# Patient Record
Sex: Female | Born: 1945
Health system: Southern US, Community
[De-identification: ages and names within clinical notes are randomized; demographics above are authoritative.]

## PROBLEM LIST (undated history)

## (undated) DIAGNOSIS — Z87442 Personal history of urinary calculi: Secondary | ICD-10-CM

## (undated) DIAGNOSIS — I10 Essential (primary) hypertension: Secondary | ICD-10-CM

## (undated) HISTORY — DX: Essential (primary) hypertension: I10

## (undated) HISTORY — PX: ABDOMINAL HYSTERECTOMY: SHX81

## (undated) HISTORY — PX: OTHER SURGICAL HISTORY: SHX169

---

## 1998-12-25 ENCOUNTER — Other Ambulatory Visit: Admission: RE | Admit: 1998-12-25 | Discharge: 1998-12-25 | Payer: Self-pay | Admitting: *Deleted

## 2000-03-30 ENCOUNTER — Other Ambulatory Visit: Admission: RE | Admit: 2000-03-30 | Discharge: 2000-03-30 | Payer: Self-pay | Admitting: *Deleted

## 2000-09-16 ENCOUNTER — Observation Stay (HOSPITAL_COMMUNITY): Admission: RE | Admit: 2000-09-16 | Discharge: 2000-09-16 | Payer: Self-pay | Admitting: Surgery

## 2000-09-16 ENCOUNTER — Encounter: Payer: Self-pay | Admitting: Surgery

## 2004-02-28 ENCOUNTER — Other Ambulatory Visit: Admission: RE | Admit: 2004-02-28 | Discharge: 2004-02-28 | Payer: Self-pay | Admitting: Obstetrics and Gynecology

## 2004-06-26 ENCOUNTER — Ambulatory Visit (HOSPITAL_COMMUNITY): Admission: RE | Admit: 2004-06-26 | Discharge: 2004-06-26 | Payer: Self-pay | Admitting: Gastroenterology

## 2006-09-16 ENCOUNTER — Other Ambulatory Visit: Admission: RE | Admit: 2006-09-16 | Discharge: 2006-09-16 | Payer: Self-pay | Admitting: Obstetrics and Gynecology

## 2011-04-04 ENCOUNTER — Other Ambulatory Visit (HOSPITAL_COMMUNITY): Payer: Self-pay | Admitting: Family Medicine

## 2011-04-04 DIAGNOSIS — Z1231 Encounter for screening mammogram for malignant neoplasm of breast: Secondary | ICD-10-CM

## 2011-05-01 ENCOUNTER — Ambulatory Visit (HOSPITAL_COMMUNITY)
Admission: RE | Admit: 2011-05-01 | Discharge: 2011-05-01 | Disposition: A | Discharge status: Home / Self Care | Payer: Self-pay | Source: Ambulatory Visit | Attending: Family Medicine | Admitting: Family Medicine

## 2011-05-01 DIAGNOSIS — Z1231 Encounter for screening mammogram for malignant neoplasm of breast: Secondary | ICD-10-CM

## 2011-05-13 ENCOUNTER — Other Ambulatory Visit: Payer: Self-pay | Admitting: Family Medicine

## 2011-05-13 DIAGNOSIS — R928 Other abnormal and inconclusive findings on diagnostic imaging of breast: Secondary | ICD-10-CM

## 2011-06-11 ENCOUNTER — Ambulatory Visit
Admission: RE | Admit: 2011-06-11 | Discharge: 2011-06-11 | Disposition: A | Discharge status: Home / Self Care | Payer: Self-pay | Source: Ambulatory Visit | Attending: Family Medicine | Admitting: Family Medicine

## 2011-06-11 DIAGNOSIS — R928 Other abnormal and inconclusive findings on diagnostic imaging of breast: Secondary | ICD-10-CM

## 2011-06-13 ENCOUNTER — Ambulatory Visit (INDEPENDENT_AMBULATORY_CARE_PROVIDER_SITE_OTHER): Payer: Self-pay | Admitting: *Deleted

## 2011-06-13 VITALS — BP 150/72 | HR 84 | Temp 97.2°F | Ht 62.0 in | Wt 155.3 lb

## 2011-06-13 DIAGNOSIS — N63 Unspecified lump in unspecified breast: Secondary | ICD-10-CM

## 2011-06-13 DIAGNOSIS — Z01419 Encounter for gynecological examination (general) (routine) without abnormal findings: Secondary | ICD-10-CM

## 2011-06-13 DIAGNOSIS — N6310 Unspecified lump in the right breast, unspecified quadrant: Secondary | ICD-10-CM

## 2011-06-13 NOTE — Progress Notes (Signed)
Last mammogram 04/30/11

## 2011-06-13 NOTE — Patient Instructions (Signed)
Taught patient how to perform BSE and gave educational materials to take home. Patient has a history of a complete hysterectomy due to fibroids per patient. Completed Pap smear today since patient couldn't remember when had last Pap smear and let patient know if Pap smear is normal she will not need any further Pap smears per BCCCP and ACOG guidelines. Patient is scheduled for a diagnostic mammogram at the [facility] for next Monday, June 16, 2011. Patient aware of appointment and will be there. Let patient know will follow up with her within the next couple weeks with results by letter or phone. Patient verbalized understanding.

## 2011-06-13 NOTE — Progress Notes (Signed)
Complaints of right breast lump.  Pap Smear:    Completed Pap smear today. Last Pap smear per patient was at least 3-4 years ago. Per patient no history of abnormal Pap smears. Patient has a history of a complete hysterectomy in 1985 related to fibroids. Informed patient that if this Pap smear comes back normal that she would not need any further Pap smears per BCCCP and ACOG guidelines. No Pap smear results are in EPIC.  Physical exam: Breasts Breasts symmetrical. No skin abnormalities bilateral breasts. No nipple retraction bilateral breasts. No nipple discharge bilateral breasts. No lymphadenopathy. No lumps palpated left breast. Palpated small lump in right breast at 7 o'clock around 3 cm from the nipple. No complaints of pain or tenderness on palpation. Patient referred to the [facility] for a diagnostic mammogram of the right breast.          Pelvic/Bimanual   Ext Genitalia No lesions, no swelling and no discharge observed on external genitalia.         Vagina Vagina pink and normal texture. No lesions or discharge observed in vagina. Vaginal Pap smear completed.         Cervix Cervix is not present related to a history of a complete hysterectomy.          Uterus Uterus is absent due to a history of a complete hysterectomy.      Adnexae Bilateral ovaries absent due to a history of a complete hysterectomy.       Rectovaginal No rectal exam completed today since patient had no rectal complaints. No skin abnormalities observed on exam.

## 2011-06-16 ENCOUNTER — Other Ambulatory Visit: Payer: Self-pay | Admitting: Family Medicine

## 2011-06-16 ENCOUNTER — Ambulatory Visit: Admission: RE | Admit: 2011-06-16 | Payer: No Typology Code available for payment source | Source: Ambulatory Visit

## 2011-06-16 ENCOUNTER — Ambulatory Visit
Admission: RE | Admit: 2011-06-16 | Discharge: 2011-06-16 | Disposition: A | Discharge status: Home / Self Care | Payer: No Typology Code available for payment source | Source: Ambulatory Visit | Attending: Family Medicine | Admitting: Family Medicine

## 2011-06-16 DIAGNOSIS — R928 Other abnormal and inconclusive findings on diagnostic imaging of breast: Secondary | ICD-10-CM

## 2011-06-26 ENCOUNTER — Telehealth: Payer: Self-pay | Admitting: *Deleted

## 2011-06-26 NOTE — Telephone Encounter (Signed)
Called patient to give Pap smear results. Let patient know Pap smear was normal and that she does not need any further Pap smears since she had a hysterectomy for benign reasons. Followed up with patient on Diagnostic Mammogram and right breast ultrasound results. Patient has received results from the [facility]. Let her know she will need a screening mammogram in November. Patient verbalized understanding.

## 2012-05-03 ENCOUNTER — Other Ambulatory Visit (HOSPITAL_COMMUNITY): Payer: Self-pay | Admitting: Family Medicine

## 2012-08-24 ENCOUNTER — Other Ambulatory Visit (HOSPITAL_COMMUNITY): Payer: Self-pay | Admitting: Family Medicine

## 2012-08-24 DIAGNOSIS — Z1231 Encounter for screening mammogram for malignant neoplasm of breast: Secondary | ICD-10-CM

## 2012-09-20 ENCOUNTER — Other Ambulatory Visit: Payer: Self-pay

## 2012-09-20 ENCOUNTER — Ambulatory Visit (HOSPITAL_COMMUNITY): Payer: Self-pay | Attending: Family Medicine

## 2012-09-20 ENCOUNTER — Ambulatory Visit
Admission: RE | Admit: 2012-09-20 | Discharge: 2012-09-20 | Disposition: A | Discharge status: Home / Self Care | Payer: Medicare Other | Source: Ambulatory Visit

## 2012-09-20 DIAGNOSIS — Z1231 Encounter for screening mammogram for malignant neoplasm of breast: Secondary | ICD-10-CM

## 2013-09-12 ENCOUNTER — Other Ambulatory Visit: Payer: Self-pay

## 2013-09-12 DIAGNOSIS — Z1231 Encounter for screening mammogram for malignant neoplasm of breast: Secondary | ICD-10-CM

## 2013-09-27 ENCOUNTER — Ambulatory Visit: Payer: Medicare Other

## 2013-10-06 ENCOUNTER — Encounter (INDEPENDENT_AMBULATORY_CARE_PROVIDER_SITE_OTHER): Payer: Self-pay

## 2013-10-06 ENCOUNTER — Ambulatory Visit
Admission: RE | Admit: 2013-10-06 | Discharge: 2013-10-06 | Disposition: A | Discharge status: Home / Self Care | Payer: Medicare Other | Source: Ambulatory Visit

## 2013-10-06 DIAGNOSIS — Z1231 Encounter for screening mammogram for malignant neoplasm of breast: Secondary | ICD-10-CM

## 2014-04-13 ENCOUNTER — Other Ambulatory Visit: Payer: Self-pay | Admitting: Gastroenterology

## 2014-06-07 ENCOUNTER — Ambulatory Visit: Payer: Self-pay | Admitting: Podiatry

## 2014-07-04 ENCOUNTER — Other Ambulatory Visit: Payer: Self-pay | Admitting: Gastroenterology

## 2014-07-07 ENCOUNTER — Encounter (HOSPITAL_COMMUNITY): Payer: Self-pay | Admitting: *Deleted

## 2014-07-18 ENCOUNTER — Ambulatory Visit (HOSPITAL_COMMUNITY): Payer: Medicare Other | Admitting: Anesthesiology

## 2014-07-18 ENCOUNTER — Encounter (HOSPITAL_COMMUNITY): Payer: Self-pay | Admitting: Anesthesiology

## 2014-07-18 ENCOUNTER — Ambulatory Visit (HOSPITAL_COMMUNITY)
Admission: RE | Admit: 2014-07-18 | Discharge: 2014-07-18 | Disposition: A | Discharge status: Home / Self Care | Payer: Medicare Other | Source: Ambulatory Visit | Attending: Gastroenterology | Admitting: Gastroenterology

## 2014-07-18 ENCOUNTER — Encounter (HOSPITAL_COMMUNITY)
Admission: RE | Disposition: A | Discharge status: Home / Self Care | Payer: Self-pay | Source: Ambulatory Visit | Attending: Gastroenterology

## 2014-07-18 DIAGNOSIS — Z1211 Encounter for screening for malignant neoplasm of colon: Secondary | ICD-10-CM | POA: Diagnosis present

## 2014-07-18 DIAGNOSIS — D125 Benign neoplasm of sigmoid colon: Secondary | ICD-10-CM | POA: Diagnosis not present

## 2014-07-18 DIAGNOSIS — I10 Essential (primary) hypertension: Secondary | ICD-10-CM | POA: Insufficient documentation

## 2014-07-18 DIAGNOSIS — M8580 Other specified disorders of bone density and structure, unspecified site: Secondary | ICD-10-CM | POA: Diagnosis not present

## 2014-07-18 DIAGNOSIS — D122 Benign neoplasm of ascending colon: Secondary | ICD-10-CM | POA: Insufficient documentation

## 2014-07-18 DIAGNOSIS — K621 Rectal polyp: Secondary | ICD-10-CM | POA: Diagnosis not present

## 2014-07-18 HISTORY — PX: COLONOSCOPY WITH PROPOFOL: SHX5780

## 2014-07-18 HISTORY — DX: Personal history of urinary calculi: Z87.442

## 2014-07-18 SURGERY — COLONOSCOPY WITH PROPOFOL
Anesthesia: Monitor Anesthesia Care

## 2014-07-18 MED ORDER — LACTATED RINGERS IV SOLN: INTRAVENOUS | Status: DC | PRN

## 2014-07-18 MED ORDER — MIDAZOLAM HCL 2 MG/2ML IJ SOLN
INTRAMUSCULAR | Status: AC
  Filled 2014-07-18: qty 2

## 2014-07-18 MED ORDER — LIDOCAINE HCL (CARDIAC) 20 MG/ML IV SOLN
INTRAVENOUS | Status: AC
  Filled 2014-07-18: qty 5

## 2014-07-18 MED ORDER — PROPOFOL INFUSION 10 MG/ML OPTIME
INTRAVENOUS | Status: DC | PRN
  Administered 2014-07-18: 100 ug/kg/min via INTRAVENOUS

## 2014-07-18 MED ORDER — PROPOFOL 10 MG/ML IV BOLUS
INTRAVENOUS | Status: AC
  Filled 2014-07-18: qty 20

## 2014-07-18 MED ORDER — SODIUM CHLORIDE 0.9 % IV SOLN: INTRAVENOUS | Status: DC

## 2014-07-18 MED ORDER — LACTATED RINGERS IV SOLN
INTRAVENOUS | Status: DC
  Administered 2014-07-18: 08:00:00 via INTRAVENOUS

## 2014-07-18 MED ORDER — MIDAZOLAM HCL 5 MG/5ML IJ SOLN
INTRAMUSCULAR | Status: DC | PRN
  Administered 2014-07-18 (×2): 1 mg via INTRAVENOUS

## 2014-07-18 SURGICAL SUPPLY — 22 items

## 2014-07-18 NOTE — Transfer of Care (Signed)
Immediate Anesthesia Transfer of Care Note  Patient: Gail Palmer  Procedure(s) Performed: Procedure(s): COLONOSCOPY WITH PROPOFOL (N/A)  Patient Location: PACU and Endoscopy Unit  Anesthesia Type:MAC  Level of Consciousness: awake, oriented and patient cooperative  Airway & Oxygen Therapy: Patient Spontanous Breathing and Patient connected to face mask oxygen  Post-op Assessment: Report given to RN and Post -op Vital signs reviewed and stable  Post vital signs: Reviewed and stable  Last Vitals:  Filed Vitals:   07/18/14 0740  BP: 160/71  Temp: 36.8 C  Resp: 12    Complications: No apparent anesthesia complications

## 2014-07-18 NOTE — Anesthesia Postprocedure Evaluation (Signed)
  Anesthesia Post-op Note  Patient: Gail Palmer  Procedure(s) Performed: Procedure(s) (LRB): COLONOSCOPY WITH PROPOFOL (N/A)  Patient Location: PACU  Anesthesia Type: MAC  Level of Consciousness: awake and alert   Airway and Oxygen Therapy: Patient Spontanous Breathing  Post-op Pain: mild  Post-op Assessment: Post-op Vital signs reviewed, Patient's Cardiovascular Status Stable, Respiratory Function Stable, Patent Airway and No signs of Nausea or vomiting  Last Vitals:  Filed Vitals:   07/18/14 0939  BP:   Pulse: 87  Temp:   Resp: 12    Post-op Vital Signs: stable   Complications: No apparent anesthesia complications

## 2014-07-18 NOTE — H&P (Signed)
  Procedure: Screening colonoscopy. Normal screening colonoscopy performed on 06/26/2004  History: The patient is a 69 year old female born January 18, 1946. She is scheduled to undergo a repeat screening colonoscopy with polypectomy to prevent colon cancer.  Medication allergies: Sulfa drugs cause rash.  Past medical history: Osteopenia. Hypertension. TAH-BSO. Cholecystectomy.  Exam: The patient is alert and lying comfortably on the endoscopy stretcher. Abdomen is soft and nontender to palpation. Lungs are clear to auscultation. Cardiac exam reveals a regular rhythm.  Plan: Proceed with screening colonoscopy

## 2014-07-18 NOTE — Op Note (Signed)
Procedure: Screening colonoscopy. Normal screening colonoscopy performed on 06/26/2004  Endoscopist: Earle Gell  Premedication: Propofol administered by anesthesia  Procedure: The patient was placed in the left lateral decubitus position. Anal inspection and digital rectal exam were normal. The Pentax pediatric colonoscope was introduced into the rectum and advanced to the cecum. A normal-appearing appendiceal orifice and ileocecal valve were identified. Colonic preparation for the exam today was good. Withdrawal time was 13 minutes  Rectum. From the distal rectum, a 3 mm sessile polyp was removed with the cold biopsy forceps. Retroflexed view of the distal rectum was otherwise normal.  Sigmoid colon. From the mid sigmoid colon, a 3 mm sessile polyp was removed with the cold biopsy forceps  Descending colon. Normal  Splenic flexure. Normal  Transverse colon. Normal  Hepatic flexure. Normal  Ascending colon. From the mid ascending colon, a 3 mm sessile polyp was removed with the cold biopsy forceps  Cecum and ileocecal valve. Normal  Assessment: A small polyp was removed from the distal rectum, a small polyp was removed from the sigmoid colon, and a small polyp was removed from the ascending colon.  Recommendation: If polyps return adenomatous pathologically, the patient should undergo a surveillance colonoscopy in 5 years. If the polyps returned nonneoplastic pathologically, she should undergo a repeat screening colonoscopy in 10 years

## 2014-07-18 NOTE — Anesthesia Preprocedure Evaluation (Addendum)
Anesthesia Evaluation  Patient identified by MRN, date of birth, ID band Patient awake    Reviewed: Allergy & Precautions, H&P , NPO status , Patient's Chart, lab work & pertinent test results, reviewed documented beta blocker date and time   Airway Mallampati: II  TM Distance: >3 FB Neck ROM: full    Dental no notable dental hx. (+) Teeth Intact, Dental Advisory Given   Pulmonary neg pulmonary ROS,  breath sounds clear to auscultation  Pulmonary exam normal       Cardiovascular Exercise Tolerance: Good hypertension, Pt. on home beta blockers Rhythm:regular Rate:Normal     Neuro/Psych negative neurological ROS  negative psych ROS   GI/Hepatic negative GI ROS, Neg liver ROS,   Endo/Other  negative endocrine ROS  Renal/GU negative Renal ROS  negative genitourinary   Musculoskeletal   Abdominal   Peds  Hematology negative hematology ROS (+)   Anesthesia Other Findings   Reproductive/Obstetrics negative OB ROS                            Anesthesia Physical Anesthesia Plan  ASA: II  Anesthesia Plan: MAC   Post-op Pain Management:    Induction:   Airway Management Planned:   Additional Equipment:   Intra-op Plan:   Post-operative Plan:   Informed Consent: I have reviewed the patients History and Physical, chart, labs and discussed the procedure including the risks, benefits and alternatives for the proposed anesthesia with the patient or authorized representative who has indicated his/her understanding and acceptance.   Dental Advisory Given  Plan Discussed with: CRNA and Surgeon  Anesthesia Plan Comments:         Anesthesia Quick Evaluation

## 2014-07-19 ENCOUNTER — Encounter (HOSPITAL_COMMUNITY): Payer: Self-pay | Admitting: Gastroenterology

## 2014-11-28 ENCOUNTER — Other Ambulatory Visit: Payer: Self-pay

## 2014-11-28 DIAGNOSIS — Z1231 Encounter for screening mammogram for malignant neoplasm of breast: Secondary | ICD-10-CM

## 2015-01-03 ENCOUNTER — Ambulatory Visit
Admission: RE | Admit: 2015-01-03 | Discharge: 2015-01-03 | Disposition: A | Discharge status: Home / Self Care | Payer: Medicare Other | Source: Ambulatory Visit

## 2015-01-03 DIAGNOSIS — Z1231 Encounter for screening mammogram for malignant neoplasm of breast: Secondary | ICD-10-CM

## 2016-03-14 ENCOUNTER — Other Ambulatory Visit: Payer: Self-pay | Admitting: Family Medicine

## 2016-03-14 DIAGNOSIS — Z1231 Encounter for screening mammogram for malignant neoplasm of breast: Secondary | ICD-10-CM

## 2016-04-09 ENCOUNTER — Ambulatory Visit
Admission: RE | Admit: 2016-04-09 | Discharge: 2016-04-09 | Disposition: A | Discharge status: Home / Self Care | Payer: Medicare Other | Source: Ambulatory Visit | Attending: Family Medicine | Admitting: Family Medicine

## 2016-04-09 DIAGNOSIS — Z1231 Encounter for screening mammogram for malignant neoplasm of breast: Secondary | ICD-10-CM

## 2017-03-03 ENCOUNTER — Other Ambulatory Visit: Payer: Self-pay | Admitting: Family Medicine

## 2017-03-03 DIAGNOSIS — Z1231 Encounter for screening mammogram for malignant neoplasm of breast: Secondary | ICD-10-CM

## 2017-04-07 ENCOUNTER — Encounter (HOSPITAL_COMMUNITY): Payer: Self-pay

## 2017-04-13 ENCOUNTER — Ambulatory Visit
Admission: RE | Admit: 2017-04-13 | Discharge: 2017-04-13 | Disposition: A | Discharge status: Home / Self Care | Payer: Medicare Other | Source: Ambulatory Visit | Attending: Family Medicine | Admitting: Family Medicine

## 2017-04-13 DIAGNOSIS — Z1231 Encounter for screening mammogram for malignant neoplasm of breast: Secondary | ICD-10-CM

## 2018-04-08 ENCOUNTER — Other Ambulatory Visit: Payer: Self-pay | Admitting: Family Medicine

## 2018-04-08 DIAGNOSIS — Z1231 Encounter for screening mammogram for malignant neoplasm of breast: Secondary | ICD-10-CM

## 2018-04-21 ENCOUNTER — Ambulatory Visit: Payer: Self-pay | Admitting: Podiatry

## 2018-05-19 ENCOUNTER — Ambulatory Visit
Admission: RE | Admit: 2018-05-19 | Discharge: 2018-05-19 | Disposition: A | Discharge status: Home / Self Care | Payer: Medicare Other | Source: Ambulatory Visit | Attending: Family Medicine | Admitting: Family Medicine

## 2018-05-19 DIAGNOSIS — Z1231 Encounter for screening mammogram for malignant neoplasm of breast: Secondary | ICD-10-CM

## 2018-06-09 ENCOUNTER — Encounter: Payer: Self-pay | Admitting: Podiatry

## 2018-06-09 ENCOUNTER — Ambulatory Visit: Payer: Medicare Other | Admitting: Podiatry

## 2018-06-09 ENCOUNTER — Ambulatory Visit (INDEPENDENT_AMBULATORY_CARE_PROVIDER_SITE_OTHER): Payer: Medicare Other

## 2018-06-09 VITALS — BP 145/76 | HR 102 | Resp 16

## 2018-06-09 DIAGNOSIS — M7661 Achilles tendinitis, right leg: Secondary | ICD-10-CM

## 2018-06-09 DIAGNOSIS — M7662 Achilles tendinitis, left leg: Secondary | ICD-10-CM

## 2018-06-09 DIAGNOSIS — M2041 Other hammer toe(s) (acquired), right foot: Secondary | ICD-10-CM

## 2018-06-09 DIAGNOSIS — M779 Enthesopathy, unspecified: Secondary | ICD-10-CM

## 2018-06-09 DIAGNOSIS — L600 Ingrowing nail: Secondary | ICD-10-CM | POA: Diagnosis not present

## 2018-06-09 MED ORDER — NEOMYCIN-POLYMYXIN-HC 3.5-10000-1 OT SOLN: OTIC | 0 refills | Status: AC

## 2018-06-09 MED ORDER — OXYCODONE-ACETAMINOPHEN 10-325 MG PO TABS: 1.0000 | ORAL_TABLET | Freq: Four times a day (QID) | ORAL | 0 refills | Status: DC | PRN

## 2018-06-09 NOTE — Progress Notes (Signed)
Subjective:   Patient ID: Gail Palmer, female   DOB: 73 y.o.   MRN: [id number]   HPI Patient presents with painful ingrown toenails of the big toes both feet for several year duration and pain in the back of the heels that occurs periodically.  Also has an elevated third digit right that can become tender with certain types of shoe gear.  Patient does not smoke and likes to be active   Review of Systems  All other systems reviewed and are negative.       Objective:  Physical Exam Vitals signs and nursing note reviewed.  Constitutional:      Appearance: She is well-developed.  Pulmonary:     Effort: Pulmonary effort is normal.  Musculoskeletal: Normal range of motion.  Skin:    General: Skin is warm.  Neurological:     Mental Status: She is alert.     Neurovascular status intact muscle strength is adequate range of motion within normal limits with patient noted to have incurvated hallux nail borders medial side bilateral hallux and elevated third digit right and mild discomfort posterior aspect heel region bilateral.  Patient has good digital perfusion well oriented x3     Assessment:  Chronic ingrown toenail deformity hallux bilateral medial border along with hammertoe deformity third right and mild Achilles tendinitis bilateral     Plan:  H&P and all 3 conditions discussed.  We will get a focus on the ingrown toenails today and I discussed correction of nailbeds and explained procedure and risk and patient wants surgery after signing consent form.  Today I infiltrated each hallux 60 mg like Marcaine mixture sterile prep applied and using sterile instrumentation remove the medial borders exposed matrix applied phenol 3 applications 30 seconds followed by alcohol lavage sterile dressing to each toe.  Applied compression dressing advised on leaving it on for 24 hours but to take it off earlier if it should start to throb and also wrote prescription for Corticosporin type  solution and encouraged to call with questions and will be seen back 2 weeks.  Discussed hammertoe deformity which can be corrected explained to her what can be done and Achilles tendon which I recommended stretching exercises for  X-rays indicate previous arthroplasty digit to right and does indicate small spurs posterior aspect heel region bilateral

## 2018-06-09 NOTE — Patient Instructions (Signed)
Soak Instructions    THE DAY AFTER THE PROCEDURE  Place 1/4 cup of epsom salts in a quart of warm tap water.  Submerge your foot or feet with outer bandage intact for the initial soak; this will allow the bandage to become moist and wet for easy lift off.  Once you remove your bandage, continue to soak in the solution for 20 minutes.  This soak should be done twice a day.  Next, remove your foot or feet from solution, blot dry the affected area and cover.  You may use a band aid large enough to cover the area or use gauze and tape.  Apply other medications to the area as directed by the doctor such as polysporin neosporin.  IF YOUR SKIN BECOMES IRRITATED WHILE USING THESE INSTRUCTIONS, IT IS OKAY TO SWITCH TO  WHITE VINEGAR AND WATER. Or you may use antibacterial soap and water to keep the toe clean  Monitor for any signs/symptoms of infection. Call the office immediately if any occur or go directly to the emergency room. Call with any questions/concerns.    Long Term Care Instructions-Post Nail Surgery  You have had your ingrown toenail and root treated with a chemical.  This chemical causes a burn that will drain and ooze like a blister.  This can drain for 6-8 weeks or longer.  It is important to keep this area clean, covered, and follow the soaking instructions dispensed at the time of your surgery.  This area will eventually dry and form a scab.  Once the scab forms you no longer need to soak or apply a dressing.  If at any time you experience an increase in pain, redness, swelling, or drainage, you should contact the office as soon as possible.  Achilles Tendinitis  with Rehab Achilles tendinitis is a disorder of the Achilles tendon. The Achilles tendon connects the large calf muscles (Gastrocnemius and Soleus) to the heel bone (calcaneus). This tendon is sometimes called the heel cord. It is important for pushing-off and standing on your toes and is important for walking, running, or  jumping. Tendinitis is often caused by overuse and repetitive microtrauma. SYMPTOMS  Pain, tenderness, swelling, warmth, and redness may occur over the Achilles tendon even at rest.  Pain with pushing off, or flexing or extending the ankle.  Pain that is worsened after or during activity. CAUSES   Overuse sometimes seen with rapid increase in exercise programs or in sports requiring running and jumping.  Poor physical conditioning (strength and flexibility or endurance).  Running sports, especially training running down hills.  Inadequate warm-up before practice or play or failure to stretch before participation.  Injury to the tendon. PREVENTION   Warm up and stretch before practice or competition.  Allow time for adequate rest and recovery between practices and competition.  Keep up conditioning.  Keep up ankle and leg flexibility.  Improve or keep muscle strength and endurance.  Improve cardiovascular fitness.  Use proper technique.  Use proper equipment (shoes, skates).  To help prevent recurrence, taping, protective strapping, or an adhesive bandage may be recommended for several weeks after healing is complete. PROGNOSIS   Recovery may take weeks to several months to heal.  Longer recovery is expected if symptoms have been prolonged.  Recovery is usually quicker if the inflammation is due to a direct blow as compared with overuse or sudden strain. RELATED COMPLICATIONS   Healing time will be prolonged if the condition is not correctly treated. The injury must be   given plenty of time to heal.  Symptoms can reoccur if activity is resumed too soon.  Untreated, tendinitis may increase the risk of tendon rupture requiring additional time for recovery and possibly surgery. TREATMENT   The first treatment consists of rest anti-inflammatory medication, and ice to relieve the pain.  Stretching and strengthening exercises after resolution of pain will likely help  reduce the risk of recurrence. Referral to a physical therapist or athletic trainer for further evaluation and treatment may be helpful.  A walking boot or cast may be recommended to rest the Achilles tendon. This can help break the cycle of inflammation and microtrauma.  Arch supports (orthotics) may be prescribed or recommended by your caregiver as an adjunct to therapy and rest.  Surgery to remove the inflamed tendon lining or degenerated tendon tissue is rarely necessary and has shown less than predictable results. MEDICATION   Nonsteroidal anti-inflammatory medications, such as aspirin and ibuprofen, may be used for pain and inflammation relief. Do not take within 7 days before surgery. Take these as directed by your caregiver. Contact your caregiver immediately if any bleeding, stomach upset, or signs of allergic reaction occur. Other minor pain relievers, such as acetaminophen, may also be used.  Pain relievers may be prescribed as necessary by your caregiver. Do not take prescription pain medication for longer than 4 to 7 days. Use only as directed and only as much as you need.  Cortisone injections are rarely indicated. Cortisone injections may weaken tendons and predispose to rupture. It is better to give the condition more time to heal than to use them. HEAT AND COLD  Cold is used to relieve pain and reduce inflammation for acute and chronic Achilles tendinitis. Cold should be applied for 10 to 15 minutes every 2 to 3 hours for inflammation and pain and immediately after any activity that aggravates your symptoms. Use ice packs or an ice massage.  Heat may be used before performing stretching and strengthening activities prescribed by your caregiver. Use a heat pack or a warm soak. SEEK MEDICAL CARE IF:  Symptoms get worse or do not improve in 2 weeks despite treatment.  New, unexplained symptoms develop. Drugs used in treatment may produce side effects.  EXERCISES:  RANGE OF  MOTION (ROM) AND STRETCHING EXERCISES - Achilles Tendinitis  These exercises may help you when beginning to rehabilitate your injury. Your symptoms may resolve with or without further involvement from your physician, physical therapist or athletic trainer. While completing these exercises, remember:   Restoring tissue flexibility helps normal motion to return to the joints. This allows healthier, less painful movement and activity.  An effective stretch should be held for at least 30 seconds.  A stretch should never be painful. You should only feel a gentle lengthening or release in the stretched tissue.  STRETCH  Gastroc, Standing   Place hands on wall.  Extend right / left leg, keeping the front knee somewhat bent.  Slightly point your toes inward on your back foot.  Keeping your right / left heel on the floor and your knee straight, shift your weight toward the wall, not allowing your back to arch.  You should feel a gentle stretch in the right / left calf. Hold this position for 10 seconds. Repeat 3 times. Complete this stretch 2 times per day.  STRETCH  Soleus, Standing   Place hands on wall.  Extend right / left leg, keeping the other knee somewhat bent.  Slightly point your toes inward on   your back foot.  Keep your right / left heel on the floor, bend your back knee, and slightly shift your weight over the back leg so that you feel a gentle stretch deep in your back calf.  Hold this position for 10 seconds. Repeat 3 times. Complete this stretch 2 times per day.  STRETCH  Gastrocsoleus, Standing  Note: This exercise can place a lot of stress on your foot and ankle. Please complete this exercise only if specifically instructed by your caregiver.   Place the ball of your right / left foot on a step, keeping your other foot firmly on the same step.  Hold on to the wall or a rail for balance.  Slowly lift your other foot, allowing your body weight to press your heel down  over the edge of the step.  You should feel a stretch in your right / left calf.  Hold this position for 10 seconds.  Repeat this exercise with a slight bend in your knee. Repeat 3 times. Complete this stretch 2 times per day.   STRENGTHENING EXERCISES - Achilles Tendinitis These exercises may help you when beginning to rehabilitate your injury. They may resolve your symptoms with or without further involvement from your physician, physical therapist or athletic trainer. While completing these exercises, remember:   Muscles can gain both the endurance and the strength needed for everyday activities through controlled exercises.  Complete these exercises as instructed by your physician, physical therapist or athletic trainer. Progress the resistance and repetitions only as guided.  You may experience muscle soreness or fatigue, but the pain or discomfort you are trying to eliminate should never worsen during these exercises. If this pain does worsen, stop and make certain you are following the directions exactly. If the pain is still present after adjustments, discontinue the exercise until you can discuss the trouble with your clinician.  STRENGTH - Plantar-flexors   Sit with your right / left leg extended. Holding onto both ends of a rubber exercise band/tubing, loop it around the ball of your foot. Keep a slight tension in the band.  Slowly push your toes away from you, pointing them downward.  Hold this position for 10 seconds. Return slowly, controlling the tension in the band/tubing. Repeat 3 times. Complete this exercise 2 times per day.   STRENGTH - Plantar-flexors   Stand with your feet shoulder width apart. Steady yourself with a wall or table using as little support as needed.  Keeping your weight evenly spread over the width of your feet, rise up on your toes.*  Hold this position for 10 seconds. Repeat 3 times. Complete this exercise 2 times per day.  *If this is too  easy, shift your weight toward your right / left leg until you feel challenged. Ultimately, you may be asked to do this exercise with your right / left foot only.  STRENGTH  Plantar-flexors, Eccentric  Note: This exercise can place a lot of stress on your foot and ankle. Please complete this exercise only if specifically instructed by your caregiver.   Place the balls of your feet on a step. With your hands, use only enough support from a wall or rail to keep your balance.  Keep your knees straight and rise up on your toes.  Slowly shift your weight entirely to your right / left toes and pick up your opposite foot. Gently and with controlled movement, lower your weight through your right / left foot so that your heel drops below   the level of the step. You will feel a slight stretch in the back of your calf at the end position.  Use the healthy leg to help rise up onto the balls of both feet, then lower weight only on the right / left leg again. Build up to 15 repetitions. Then progress to 3 consecutive sets of 15 repetitions.*  After completing the above exercise, complete the same exercise with a slight knee bend (about 30 degrees). Again, build up to 15 repetitions. Then progress to 3 consecutive sets of 15 repetitions.* Perform this exercise 2 times per day.  *When you easily complete 3 sets of 15, your physician, physical therapist or athletic trainer may advise you to add resistance by wearing a backpack filled with additional weight.  STRENGTH - Plantar Flexors, Seated   Sit on a chair that allows your feet to rest flat on the ground. If necessary, sit at the edge of the chair.  Keeping your toes firmly on the ground, lift your right / left heel as far as you can without increasing any discomfort in your ankle. Repeat 3 times. Complete this exercise 2 times a day.  

## 2018-06-09 NOTE — Progress Notes (Signed)
   Subjective:    Patient ID: Gail Palmer, female    DOB: 01-05-1946, 73 y.o.   MRN: [id number]  HPI    Review of Systems  All other systems reviewed and are negative.      Objective:   Physical Exam        Assessment & Plan:

## 2018-06-21 ENCOUNTER — Telehealth: Payer: Self-pay | Admitting: Podiatry

## 2018-06-21 NOTE — Telephone Encounter (Addendum)
I spoke with pt's dtr, Judeen Hammans, she stated the epsom salt and white vinegar are painful to the pt's toe. I offered the use of betadine and to make sure the epsom salt was 1/4 cup in one qt of water, or 1/2 white vinegar in 1 quart of warm water. I told Judeen Hammans since the areas were open that was causing the stinging but should gradually decrease.

## 2018-06-21 NOTE — Telephone Encounter (Signed)
I have a question regarding the toenail procedure my mother had.

## 2018-06-23 ENCOUNTER — Ambulatory Visit: Payer: Medicare Other

## 2018-06-24 ENCOUNTER — Ambulatory Visit (INDEPENDENT_AMBULATORY_CARE_PROVIDER_SITE_OTHER): Payer: Medicare Other | Admitting: Podiatry

## 2018-06-24 ENCOUNTER — Encounter: Payer: Self-pay | Admitting: Podiatry

## 2018-06-24 DIAGNOSIS — L600 Ingrowing nail: Secondary | ICD-10-CM

## 2018-06-24 DIAGNOSIS — M2041 Other hammer toe(s) (acquired), right foot: Secondary | ICD-10-CM | POA: Diagnosis not present

## 2018-06-24 NOTE — Progress Notes (Signed)
Subjective:   Patient ID: Gail Palmer, female   DOB: 73 y.o.   MRN: [id number]   HPI Patient presents stating nail is healing well and had questions concerning her hammertoe third digit right foot   ROS      Objective:  Physical Exam  Neurovascular status intact with crusted hallux nail borders bilateral hallux from previous permanent nail surgery they are healing well with no drainage with elevated third digit right foot     Assessment:  Healing well from nail surgery bilateral with no signs of infection with elevated third digit right that is painful on dorsal surface      Plan:  Reviewed hammertoe deformity and discussed correction of either arthroplasty or tenotomy and at this time she is can wear wider shoes and consider this treatment.  Continue soaking nails continue bandage usage during the day and patient will be seen back as needed

## 2018-07-06 ENCOUNTER — Other Ambulatory Visit: Payer: Self-pay | Admitting: Family Medicine

## 2018-07-06 DIAGNOSIS — I739 Peripheral vascular disease, unspecified: Secondary | ICD-10-CM

## 2018-07-14 ENCOUNTER — Ambulatory Visit
Admission: RE | Admit: 2018-07-14 | Discharge: 2018-07-14 | Disposition: A | Discharge status: Home / Self Care | Payer: Medicare Other | Source: Ambulatory Visit | Attending: Family Medicine | Admitting: Family Medicine

## 2018-07-14 DIAGNOSIS — I739 Peripheral vascular disease, unspecified: Secondary | ICD-10-CM

## 2019-04-25 ENCOUNTER — Other Ambulatory Visit: Payer: Self-pay | Admitting: Family Medicine

## 2019-04-25 DIAGNOSIS — Z1231 Encounter for screening mammogram for malignant neoplasm of breast: Secondary | ICD-10-CM

## 2019-06-16 ENCOUNTER — Other Ambulatory Visit: Payer: Self-pay

## 2019-06-16 ENCOUNTER — Ambulatory Visit
Admission: RE | Admit: 2019-06-16 | Discharge: 2019-06-16 | Disposition: A | Discharge status: Home / Self Care | Payer: Medicare Other | Source: Ambulatory Visit | Attending: Family Medicine | Admitting: Family Medicine

## 2019-06-16 DIAGNOSIS — Z1231 Encounter for screening mammogram for malignant neoplasm of breast: Secondary | ICD-10-CM

## 2019-07-18 DIAGNOSIS — E7800 Pure hypercholesterolemia, unspecified: Secondary | ICD-10-CM | POA: Diagnosis not present

## 2019-07-18 DIAGNOSIS — I10 Essential (primary) hypertension: Secondary | ICD-10-CM | POA: Diagnosis not present

## 2019-09-26 DIAGNOSIS — J310 Chronic rhinitis: Secondary | ICD-10-CM | POA: Insufficient documentation

## 2019-09-26 DIAGNOSIS — H6123 Impacted cerumen, bilateral: Secondary | ICD-10-CM | POA: Insufficient documentation

## 2019-09-26 DIAGNOSIS — J3489 Other specified disorders of nose and nasal sinuses: Secondary | ICD-10-CM | POA: Insufficient documentation

## 2019-09-26 DIAGNOSIS — H6993 Unspecified Eustachian tube disorder, bilateral: Secondary | ICD-10-CM | POA: Insufficient documentation

## 2019-09-26 DIAGNOSIS — J343 Hypertrophy of nasal turbinates: Secondary | ICD-10-CM | POA: Diagnosis not present

## 2019-09-26 DIAGNOSIS — H6983 Other specified disorders of Eustachian tube, bilateral: Secondary | ICD-10-CM | POA: Diagnosis not present

## 2019-11-08 DIAGNOSIS — M109 Gout, unspecified: Secondary | ICD-10-CM | POA: Diagnosis not present

## 2019-11-08 DIAGNOSIS — I10 Essential (primary) hypertension: Secondary | ICD-10-CM | POA: Diagnosis not present

## 2019-11-08 DIAGNOSIS — E7800 Pure hypercholesterolemia, unspecified: Secondary | ICD-10-CM | POA: Diagnosis not present

## 2019-12-22 DIAGNOSIS — E7800 Pure hypercholesterolemia, unspecified: Secondary | ICD-10-CM | POA: Diagnosis not present

## 2019-12-22 DIAGNOSIS — I10 Essential (primary) hypertension: Secondary | ICD-10-CM | POA: Diagnosis not present

## 2020-01-30 ENCOUNTER — Other Ambulatory Visit: Payer: Self-pay

## 2020-01-30 ENCOUNTER — Ambulatory Visit (INDEPENDENT_AMBULATORY_CARE_PROVIDER_SITE_OTHER): Payer: Medicare Other | Admitting: Podiatry

## 2020-01-30 ENCOUNTER — Encounter: Payer: Self-pay | Admitting: Podiatry

## 2020-01-30 VITALS — Temp 97.1°F

## 2020-01-30 DIAGNOSIS — L600 Ingrowing nail: Secondary | ICD-10-CM

## 2020-01-30 MED ORDER — NEOMYCIN-POLYMYXIN-HC 3.5-10000-1 OT SOLN: OTIC | 0 refills | Status: AC

## 2020-01-30 NOTE — Patient Instructions (Signed)

## 2020-01-30 NOTE — Progress Notes (Signed)
Subjective:   Patient ID: Gail Palmer, female   DOB: 74 y.o.   MRN: [id number]   HPI Patient has pain in the medial side of each nail stating that its been about 6 months and there is some skin that gets irritated to and she like somehow to be able to get improved.   ROS      Objective:  Physical Exam  Neurovascular status intact with incurvated medial borders of the right hallux with thick tight nailbeds that are painful when pressed along with thick type tissue which is more due to her skin structure     Assessment:  Chronic ingrown toenail deformity of the hallux bilateral medial side     Plan:  H&P reviewed condition recommended removal of the nail borders explained procedure risk.  Patient wants surgery understanding risk and today I infiltrated each hallux 60 mg like Marcaine mixture and under sterile conditions I remove the medial border exposed matrix bilateral and applied phenol 3 applications 30 seconds followed by alcohol lavage and sterile dressing.  Gave instructions on soaks and reappoint and encouraged her to call with questions and leave dressing on 24 hours but take it off earlier if any throbbing were to occur

## 2020-02-20 DIAGNOSIS — Z1159 Encounter for screening for other viral diseases: Secondary | ICD-10-CM | POA: Diagnosis not present

## 2020-02-23 DIAGNOSIS — D123 Benign neoplasm of transverse colon: Secondary | ICD-10-CM | POA: Diagnosis not present

## 2020-02-23 DIAGNOSIS — Z86010 Personal history of colonic polyps: Secondary | ICD-10-CM | POA: Diagnosis not present

## 2020-02-23 DIAGNOSIS — D124 Benign neoplasm of descending colon: Secondary | ICD-10-CM | POA: Diagnosis not present

## 2020-02-23 DIAGNOSIS — K5730 Diverticulosis of large intestine without perforation or abscess without bleeding: Secondary | ICD-10-CM | POA: Diagnosis not present

## 2020-02-28 DIAGNOSIS — D123 Benign neoplasm of transverse colon: Secondary | ICD-10-CM | POA: Diagnosis not present

## 2020-02-28 DIAGNOSIS — D124 Benign neoplasm of descending colon: Secondary | ICD-10-CM | POA: Diagnosis not present

## 2020-05-08 ENCOUNTER — Other Ambulatory Visit: Payer: Self-pay | Admitting: Family Medicine

## 2020-05-08 DIAGNOSIS — I10 Essential (primary) hypertension: Secondary | ICD-10-CM | POA: Diagnosis not present

## 2020-05-08 DIAGNOSIS — Z9109 Other allergy status, other than to drugs and biological substances: Secondary | ICD-10-CM | POA: Diagnosis not present

## 2020-05-08 DIAGNOSIS — Z1389 Encounter for screening for other disorder: Secondary | ICD-10-CM | POA: Diagnosis not present

## 2020-05-08 DIAGNOSIS — Z1231 Encounter for screening mammogram for malignant neoplasm of breast: Secondary | ICD-10-CM

## 2020-05-08 DIAGNOSIS — E7800 Pure hypercholesterolemia, unspecified: Secondary | ICD-10-CM | POA: Diagnosis not present

## 2020-05-08 DIAGNOSIS — Z0000 Encounter for general adult medical examination without abnormal findings: Secondary | ICD-10-CM | POA: Diagnosis not present

## 2020-05-22 DIAGNOSIS — H2513 Age-related nuclear cataract, bilateral: Secondary | ICD-10-CM | POA: Diagnosis not present

## 2020-05-22 DIAGNOSIS — H5203 Hypermetropia, bilateral: Secondary | ICD-10-CM | POA: Diagnosis not present

## 2020-06-20 ENCOUNTER — Other Ambulatory Visit: Payer: Self-pay

## 2020-06-20 ENCOUNTER — Ambulatory Visit
Admission: RE | Admit: 2020-06-20 | Discharge: 2020-06-20 | Disposition: A | Discharge status: Home / Self Care | Payer: Medicare Other | Source: Ambulatory Visit | Attending: Family Medicine | Admitting: Family Medicine

## 2020-06-20 DIAGNOSIS — Z1231 Encounter for screening mammogram for malignant neoplasm of breast: Secondary | ICD-10-CM

## 2020-09-24 DIAGNOSIS — E7800 Pure hypercholesterolemia, unspecified: Secondary | ICD-10-CM | POA: Diagnosis not present

## 2020-09-24 DIAGNOSIS — M8580 Other specified disorders of bone density and structure, unspecified site: Secondary | ICD-10-CM | POA: Diagnosis not present

## 2020-09-24 DIAGNOSIS — I10 Essential (primary) hypertension: Secondary | ICD-10-CM | POA: Diagnosis not present

## 2020-09-28 IMAGING — MG DIGITAL SCREENING BILATERAL MAMMOGRAM WITH TOMO AND CAD
8 series · 8 of 24 positions shown · non-contrast
Comparison: Previous exam(s).

CLINICAL DATA: Screening.

EXAM:
DIGITAL SCREENING BILATERAL MAMMOGRAM WITH TOMO AND CAD

[L CC synth-2D]
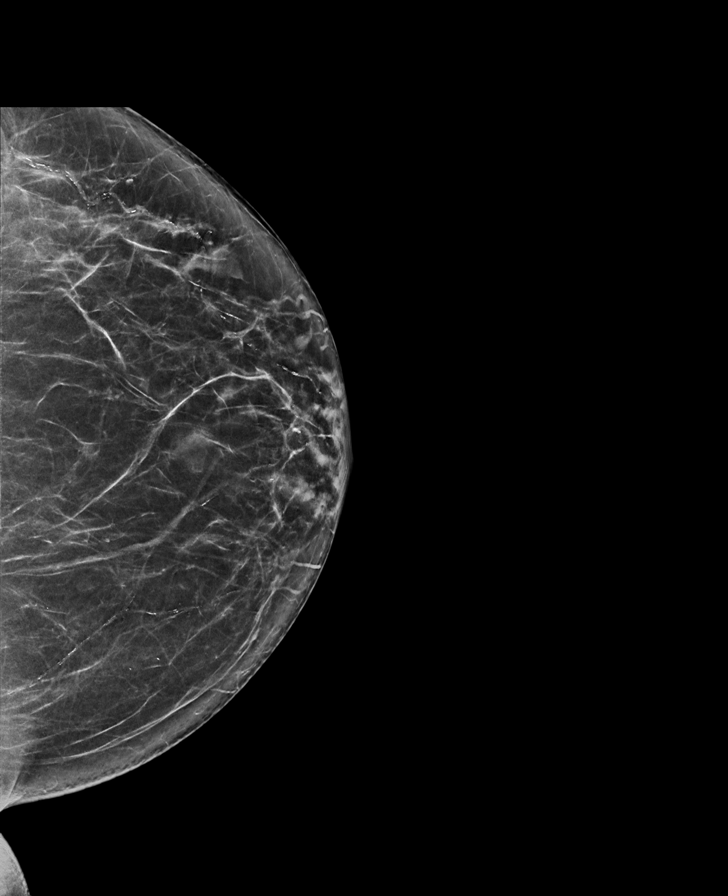

[L MLO synth-2D]
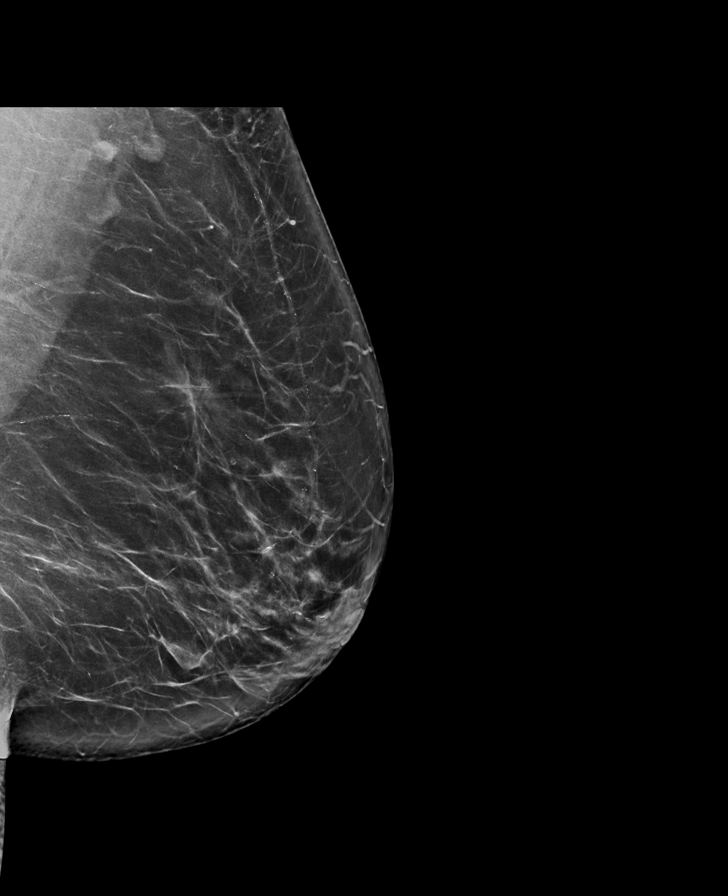

[R CC synth-2D]
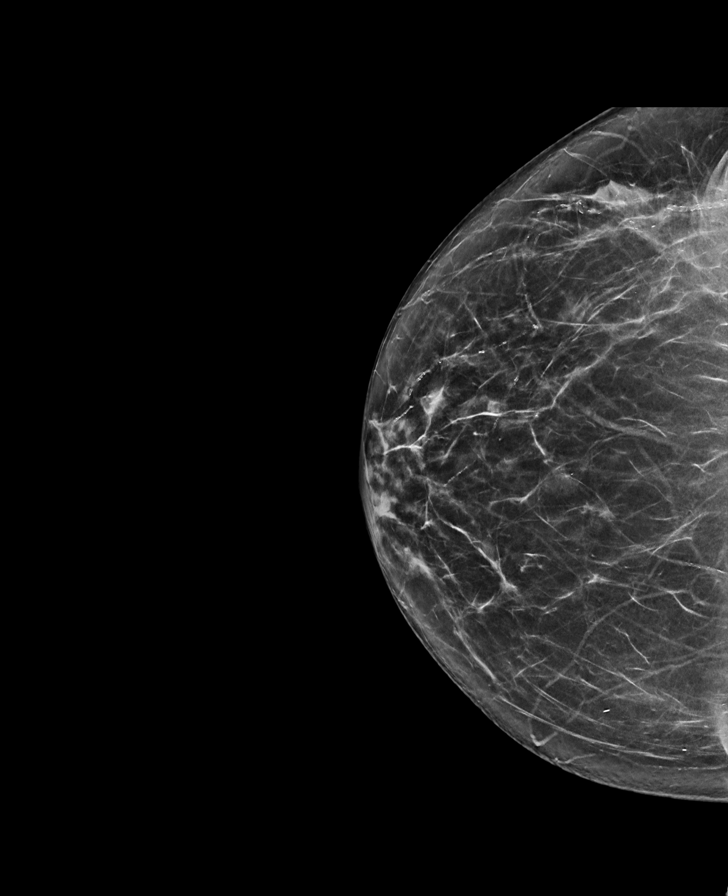

[R MLO synth-2D]
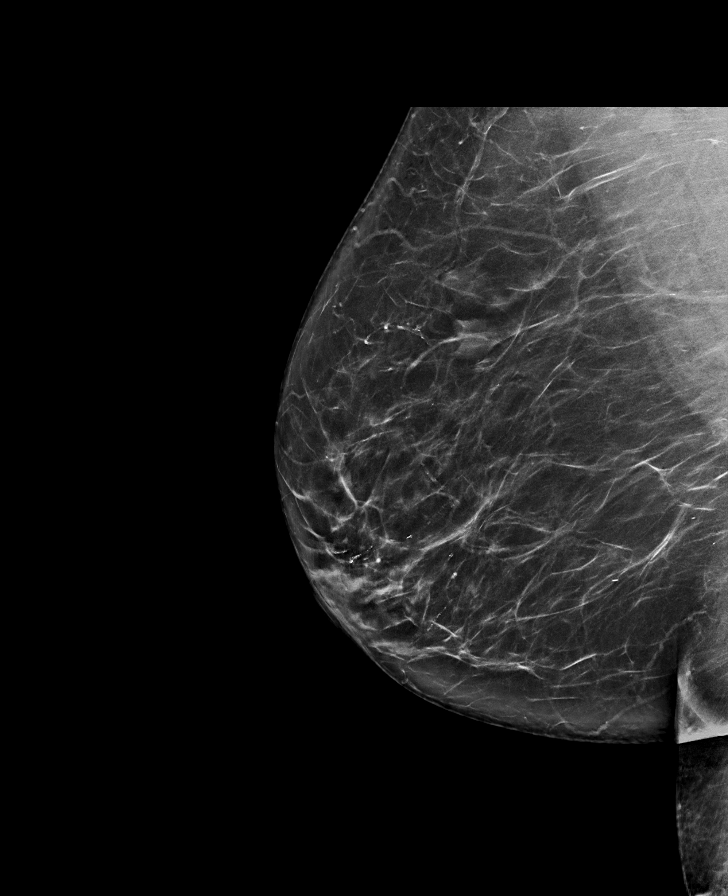

[L CC tomo · tomo slice 37/74.0]
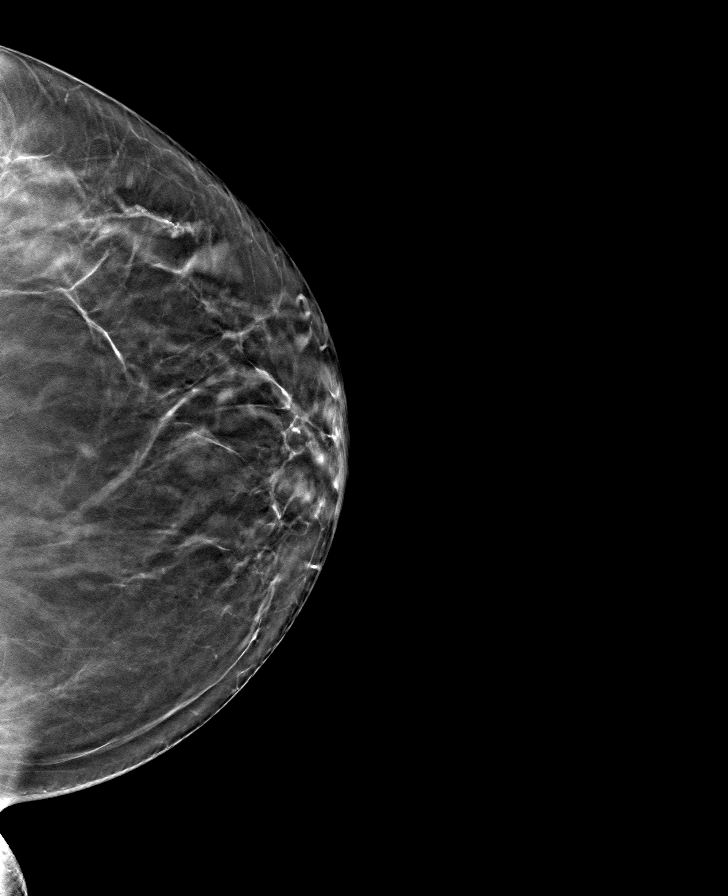

[R MLO tomo · tomo slice 45/89.0]
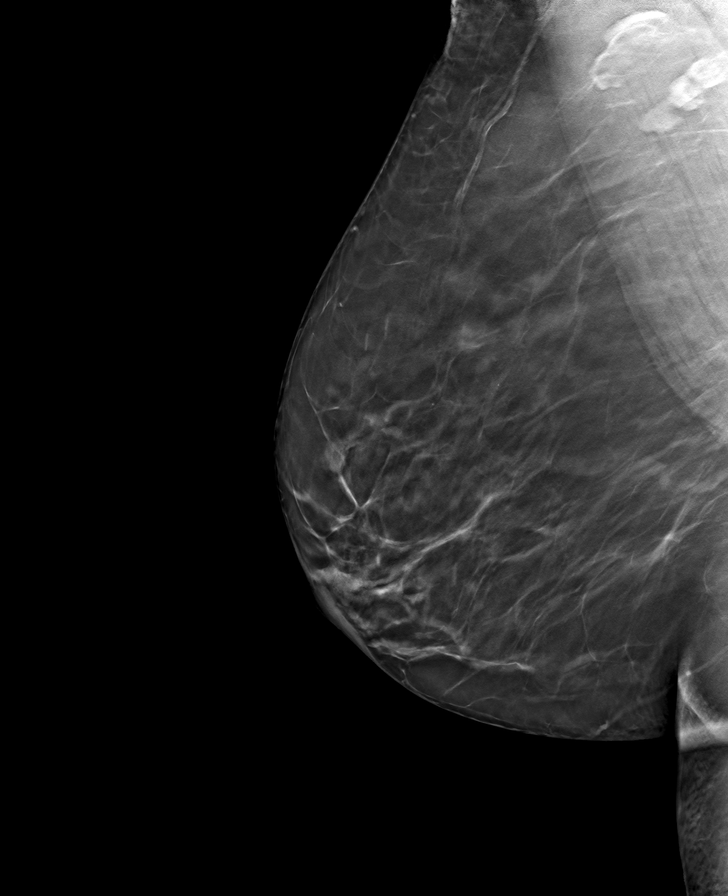

[R CC tomo · tomo slice 40/79.0]
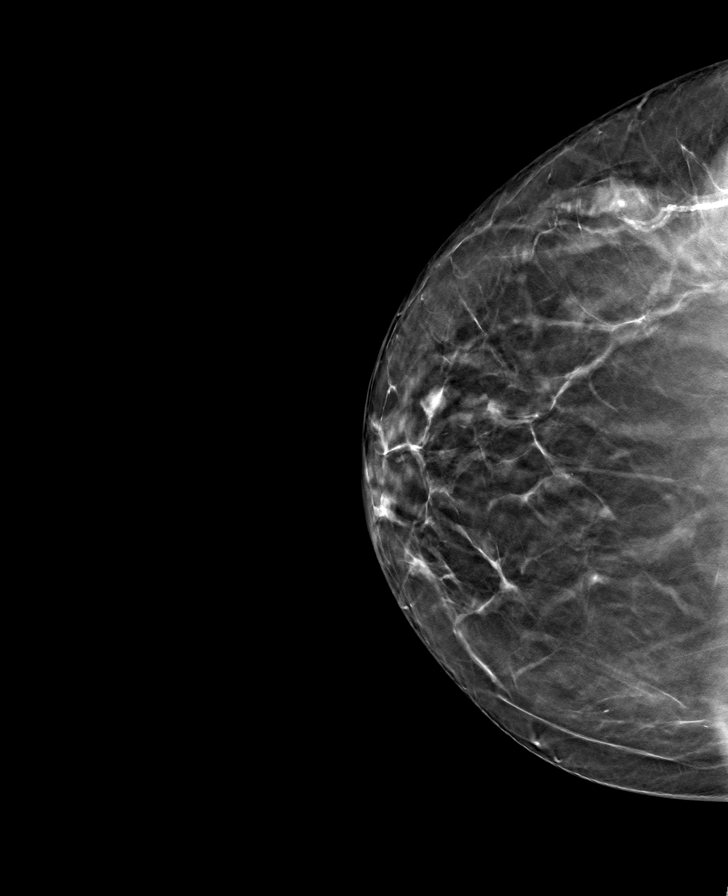

[L MLO tomo · tomo slice 47/94.0]
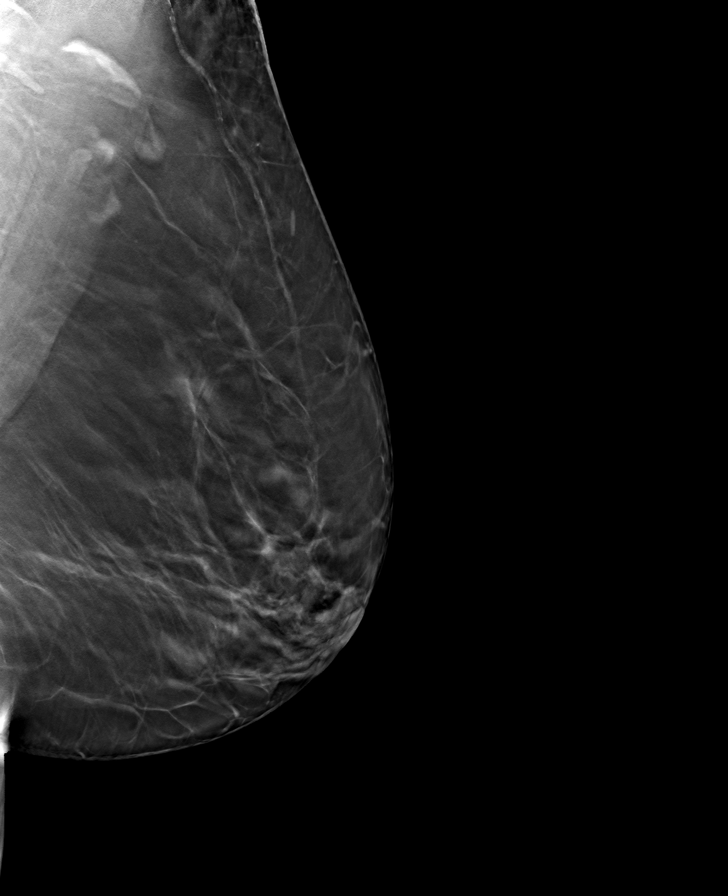

[8 of 24 positions shown; findings below may reference images not displayed]

ACR Breast Density Category b: There are scattered areas of
fibroglandular density.
FINDINGS: There are no findings suspicious for malignancy. Images were
processed with CAD.
IMPRESSION: No mammographic evidence of malignancy. A result letter of this
screening mammogram will be mailed directly to the patient.

RECOMMENDATION:
Screening mammogram in one year. (Code:[id number])

BI-RADS CATEGORY  1: Negative.

## 2020-10-10 DIAGNOSIS — I10 Essential (primary) hypertension: Secondary | ICD-10-CM | POA: Diagnosis not present

## 2020-10-10 DIAGNOSIS — M8580 Other specified disorders of bone density and structure, unspecified site: Secondary | ICD-10-CM | POA: Diagnosis not present

## 2020-10-10 DIAGNOSIS — E7800 Pure hypercholesterolemia, unspecified: Secondary | ICD-10-CM | POA: Diagnosis not present

## 2020-11-12 DIAGNOSIS — E7800 Pure hypercholesterolemia, unspecified: Secondary | ICD-10-CM | POA: Diagnosis not present

## 2020-11-12 DIAGNOSIS — Z9109 Other allergy status, other than to drugs and biological substances: Secondary | ICD-10-CM | POA: Diagnosis not present

## 2020-11-12 DIAGNOSIS — I10 Essential (primary) hypertension: Secondary | ICD-10-CM | POA: Diagnosis not present

## 2020-11-12 DIAGNOSIS — M8580 Other specified disorders of bone density and structure, unspecified site: Secondary | ICD-10-CM | POA: Diagnosis not present

## 2020-11-12 DIAGNOSIS — M109 Gout, unspecified: Secondary | ICD-10-CM | POA: Diagnosis not present

## 2020-12-24 DIAGNOSIS — M8580 Other specified disorders of bone density and structure, unspecified site: Secondary | ICD-10-CM | POA: Diagnosis not present

## 2020-12-24 DIAGNOSIS — I10 Essential (primary) hypertension: Secondary | ICD-10-CM | POA: Diagnosis not present

## 2020-12-24 DIAGNOSIS — E7800 Pure hypercholesterolemia, unspecified: Secondary | ICD-10-CM | POA: Diagnosis not present

## 2021-02-14 DIAGNOSIS — L989 Disorder of the skin and subcutaneous tissue, unspecified: Secondary | ICD-10-CM | POA: Diagnosis not present

## 2021-02-14 DIAGNOSIS — Z9109 Other allergy status, other than to drugs and biological substances: Secondary | ICD-10-CM | POA: Diagnosis not present

## 2021-04-05 DIAGNOSIS — D1721 Benign lipomatous neoplasm of skin and subcutaneous tissue of right arm: Secondary | ICD-10-CM | POA: Diagnosis not present

## 2021-05-15 ENCOUNTER — Other Ambulatory Visit: Payer: Self-pay | Admitting: Family Medicine

## 2021-05-15 DIAGNOSIS — Z1231 Encounter for screening mammogram for malignant neoplasm of breast: Secondary | ICD-10-CM

## 2021-06-12 ENCOUNTER — Other Ambulatory Visit: Payer: Self-pay

## 2021-06-12 ENCOUNTER — Ambulatory Visit
Admission: RE | Admit: 2021-06-12 | Discharge: 2021-06-12 | Disposition: A | Discharge status: Home / Self Care | Payer: Commercial Managed Care - HMO | Source: Ambulatory Visit | Attending: Family Medicine | Admitting: Family Medicine

## 2021-06-12 DIAGNOSIS — Z1231 Encounter for screening mammogram for malignant neoplasm of breast: Secondary | ICD-10-CM

## 2021-06-14 DIAGNOSIS — H52203 Unspecified astigmatism, bilateral: Secondary | ICD-10-CM | POA: Diagnosis not present

## 2021-06-14 DIAGNOSIS — H2513 Age-related nuclear cataract, bilateral: Secondary | ICD-10-CM | POA: Diagnosis not present

## 2021-07-01 ENCOUNTER — Other Ambulatory Visit: Payer: Self-pay

## 2021-07-01 ENCOUNTER — Ambulatory Visit (INDEPENDENT_AMBULATORY_CARE_PROVIDER_SITE_OTHER): Payer: Medicare Other | Admitting: Podiatry

## 2021-07-01 ENCOUNTER — Encounter: Payer: Self-pay | Admitting: Podiatry

## 2021-07-01 ENCOUNTER — Ambulatory Visit (INDEPENDENT_AMBULATORY_CARE_PROVIDER_SITE_OTHER): Payer: Medicare Other

## 2021-07-01 DIAGNOSIS — M722 Plantar fascial fibromatosis: Secondary | ICD-10-CM

## 2021-07-01 DIAGNOSIS — M7662 Achilles tendinitis, left leg: Secondary | ICD-10-CM

## 2021-07-01 MED ORDER — TRIAMCINOLONE ACETONIDE 10 MG/ML IJ SUSP
10.0000 mg | Freq: Once | INTRAMUSCULAR | Status: AC
  Administered 2021-07-01: 10 mg

## 2021-07-01 NOTE — Progress Notes (Signed)
Subjective:   Patient ID: Gail Palmer, female   DOB: 76 y.o.   MRN: [id number]   HPI Patient has started off a lot of pain in the back of her left heel over the last couple months on the outside and it is hard to wear shoe gear or be active   ROS      Objective:  Physical Exam  Neurovascular status intact with patient found to have posterior Achilles tendinitis left at the insertion with no center medial involvement with all pain on the lateral side no indication of muscle strength loss or equinus condition     Assessment:  Acute Achilles tendinitis left with inflammation fluid     Plan:  H&P reviewed condition and x-ray today I did sterile prep after explaining to her the chances for rupture associated with injection she is willing to accept risk and I carefully injected the lateral side 3 mg dexamethasone Kenalog 5 mg Xylocaine keep it away from the center medial side of the heel.  Advised on ice reduced activity and reappoint to recheck  X-rays indicate posterior spur no indications of stress fracture arthritis

## 2021-07-05 ENCOUNTER — Ambulatory Visit
Admission: RE | Admit: 2021-07-05 | Discharge: 2021-07-05 | Disposition: A | Discharge status: Home / Self Care | Payer: Medicare Other | Source: Ambulatory Visit | Attending: Family Medicine | Admitting: Family Medicine

## 2021-07-05 DIAGNOSIS — Z1231 Encounter for screening mammogram for malignant neoplasm of breast: Secondary | ICD-10-CM

## 2021-08-02 ENCOUNTER — Other Ambulatory Visit: Payer: Self-pay

## 2021-08-02 ENCOUNTER — Ambulatory Visit (INDEPENDENT_AMBULATORY_CARE_PROVIDER_SITE_OTHER): Payer: Medicare Other | Admitting: Podiatry

## 2021-08-02 ENCOUNTER — Encounter: Payer: Self-pay | Admitting: Podiatry

## 2021-08-02 DIAGNOSIS — M7662 Achilles tendinitis, left leg: Secondary | ICD-10-CM

## 2021-08-04 NOTE — Progress Notes (Signed)
Subjective:  ? ?Patient ID: Gail Palmer, female   DOB: 76 y.o.   MRN: [id number]  ? ?HPI ?Patient states she is having intensification of discomfort in the back of her left heel and also somewhat into the bottom.  States she is having trouble being able to ambulate and cannot wear shoe gear with any degree of comfort ? ? ?ROS ? ? ?   ?Objective:  ?Physical Exam  ?Muscular status intact with patient's left posterior heel being quite sore with inflammation and moderate discomfort into the plantar fascia left ? ?   ?Assessment:  ?Acute Achilles tendinitis left heel with moderate plantar fasciitis ? ?   ?Plan:  ?H&P reviewed condition educated her on deformity and we want to be careful with any further steroid injections.  I did do sterile prep and advised on topical medicines stretching and ice and I did dispense air fracture walker to completely immobilize the lower leg and take stress off the tendon and hopefully allow it to heal.  Discussed we may need to do physical therapy ?   ? ? ?

## 2021-08-12 DIAGNOSIS — M8580 Other specified disorders of bone density and structure, unspecified site: Secondary | ICD-10-CM | POA: Diagnosis not present

## 2021-08-12 DIAGNOSIS — Z0000 Encounter for general adult medical examination without abnormal findings: Secondary | ICD-10-CM | POA: Diagnosis not present

## 2021-08-12 DIAGNOSIS — Z1159 Encounter for screening for other viral diseases: Secondary | ICD-10-CM | POA: Diagnosis not present

## 2021-08-12 DIAGNOSIS — I10 Essential (primary) hypertension: Secondary | ICD-10-CM | POA: Diagnosis not present

## 2021-08-12 DIAGNOSIS — E7800 Pure hypercholesterolemia, unspecified: Secondary | ICD-10-CM | POA: Diagnosis not present

## 2021-08-12 DIAGNOSIS — J309 Allergic rhinitis, unspecified: Secondary | ICD-10-CM | POA: Diagnosis not present

## 2021-08-30 ENCOUNTER — Ambulatory Visit (INDEPENDENT_AMBULATORY_CARE_PROVIDER_SITE_OTHER): Payer: Medicare Other | Admitting: Podiatry

## 2021-08-30 ENCOUNTER — Encounter: Payer: Self-pay | Admitting: Podiatry

## 2021-08-30 DIAGNOSIS — M722 Plantar fascial fibromatosis: Secondary | ICD-10-CM

## 2021-08-30 DIAGNOSIS — M7662 Achilles tendinitis, left leg: Secondary | ICD-10-CM

## 2021-08-30 NOTE — Progress Notes (Signed)
Subjective:  ? ?Patient ID: Gail Palmer, female   DOB: 76 y.o.   MRN: [id number]  ? ?HPI ?Patient states that she is improving with pain only when she is real active but states the boot helps a lot ? ? ?ROS ? ? ?   ?Objective:  ?Physical Exam  ?Neurovascular status intact with patient's left Achilles improving pain still present but quite a bit better ? ?   ?Assessment:  ?Achilles tendinitis responded to immobilization ? ?   ?Plan:  ?Recommended the continuation of conservative care with boot usage with gradual reduction and may eventually require physical therapy for this condition ?   ? ? ?

## 2022-02-17 DIAGNOSIS — D179 Benign lipomatous neoplasm, unspecified: Secondary | ICD-10-CM | POA: Diagnosis not present

## 2022-02-17 DIAGNOSIS — E7800 Pure hypercholesterolemia, unspecified: Secondary | ICD-10-CM | POA: Diagnosis not present

## 2022-02-17 DIAGNOSIS — J309 Allergic rhinitis, unspecified: Secondary | ICD-10-CM | POA: Diagnosis not present

## 2022-02-17 DIAGNOSIS — I10 Essential (primary) hypertension: Secondary | ICD-10-CM | POA: Diagnosis not present

## 2022-02-17 DIAGNOSIS — M109 Gout, unspecified: Secondary | ICD-10-CM | POA: Diagnosis not present

## 2022-03-21 DIAGNOSIS — D171 Benign lipomatous neoplasm of skin and subcutaneous tissue of trunk: Secondary | ICD-10-CM | POA: Diagnosis not present

## 2022-05-02 DIAGNOSIS — J3089 Other allergic rhinitis: Secondary | ICD-10-CM | POA: Diagnosis not present

## 2022-06-04 ENCOUNTER — Other Ambulatory Visit: Payer: Self-pay | Admitting: Family Medicine

## 2022-06-04 DIAGNOSIS — Z1231 Encounter for screening mammogram for malignant neoplasm of breast: Secondary | ICD-10-CM

## 2022-06-18 DIAGNOSIS — H52203 Unspecified astigmatism, bilateral: Secondary | ICD-10-CM | POA: Diagnosis not present

## 2022-06-18 DIAGNOSIS — H2513 Age-related nuclear cataract, bilateral: Secondary | ICD-10-CM | POA: Diagnosis not present

## 2022-07-24 ENCOUNTER — Ambulatory Visit
Admission: RE | Admit: 2022-07-24 | Discharge: 2022-07-24 | Disposition: A | Discharge status: Home / Self Care | Payer: 59 | Source: Ambulatory Visit | Attending: Family Medicine | Admitting: Family Medicine

## 2022-07-24 DIAGNOSIS — Z1231 Encounter for screening mammogram for malignant neoplasm of breast: Secondary | ICD-10-CM

## 2022-09-04 DIAGNOSIS — E7800 Pure hypercholesterolemia, unspecified: Secondary | ICD-10-CM | POA: Diagnosis not present

## 2022-09-04 DIAGNOSIS — I10 Essential (primary) hypertension: Secondary | ICD-10-CM | POA: Diagnosis not present

## 2022-09-04 DIAGNOSIS — M109 Gout, unspecified: Secondary | ICD-10-CM | POA: Diagnosis not present

## 2022-09-04 DIAGNOSIS — J309 Allergic rhinitis, unspecified: Secondary | ICD-10-CM | POA: Diagnosis not present

## 2022-09-04 DIAGNOSIS — Z0000 Encounter for general adult medical examination without abnormal findings: Secondary | ICD-10-CM | POA: Diagnosis not present

## 2022-09-04 DIAGNOSIS — N1831 Chronic kidney disease, stage 3a: Secondary | ICD-10-CM | POA: Diagnosis not present

## 2022-09-04 DIAGNOSIS — M8580 Other specified disorders of bone density and structure, unspecified site: Secondary | ICD-10-CM | POA: Diagnosis not present

## 2022-10-13 ENCOUNTER — Ambulatory Visit (INDEPENDENT_AMBULATORY_CARE_PROVIDER_SITE_OTHER): Payer: 59 | Admitting: Podiatry

## 2022-10-13 DIAGNOSIS — M7661 Achilles tendinitis, right leg: Secondary | ICD-10-CM

## 2022-10-13 DIAGNOSIS — M79675 Pain in left toe(s): Secondary | ICD-10-CM | POA: Diagnosis not present

## 2022-10-13 DIAGNOSIS — M7662 Achilles tendinitis, left leg: Secondary | ICD-10-CM | POA: Diagnosis not present

## 2022-10-13 DIAGNOSIS — L609 Nail disorder, unspecified: Secondary | ICD-10-CM

## 2022-10-13 DIAGNOSIS — M79674 Pain in right toe(s): Secondary | ICD-10-CM

## 2022-10-13 MED ORDER — TRIAMCINOLONE ACETONIDE 10 MG/ML IJ SUSP
10.0000 mg | Freq: Once | INTRAMUSCULAR | Status: AC
  Administered 2022-10-13: 10 mg

## 2022-10-13 NOTE — Progress Notes (Signed)
Chief Complaint  Patient presents with   Foot Pain    Patient said that her pcp has suggested that she gets bil injections. Bil great toes has some discomfort and seem hard.    HPI: 77 y.o. female presenting today with c/o pain in the back of both heels.  She has been seen here for this issue in the past history).  Of immobilization in a walking boot for a while.  This did provide mild improvement but she had pain upon returning to normal shoe gear.  She states that she has just been putting off following up but the heel pain has been present denies any new injury since last seen.  Patient also notes pain along both great toenails along the medial nail margin.  She had ingrown nail procedures by Dr. Charlsie Merles in the past and feels that the skin was forward occasionally gets tender in the area.  She notes that sometimes it feels hot.  She does get pedicures.  Denies any drainage.  Past Medical History:  Diagnosis Date   History of kidney stones 15 yrs ago   Hypertension     Past Surgical History:  Procedure Laterality Date   ABDOMINAL HYSTERECTOMY  yrs ago   complete   COLONOSCOPY WITH PROPOFOL N/A 07/18/2014   Procedure: COLONOSCOPY WITH PROPOFOL;  Surgeon: Charolett Bumpers, MD;  Location: [facility];  Service: Endoscopy;  Laterality: N/A;   kidney stone surgery  15 yrs ago    Allergies  Allergen Reactions   Atelvia [Risedronate Sodium] Diarrhea   Boniva [Ibandronic Acid]     Visual issues   Hydrocodone     headache   Hydrocodone Bit-Homatrop Mbr     Other Reaction(s): headache from this, Unknown   Latex Rash   Sulfa Antibiotics Rash     Physical Exam: General: The patient is alert and oriented x3 in no acute distress.  Dermatology:  No ecchymosis, erythema, or edema bilateral.  No open lesions.  The bilateral hallux has some thickening of the nail along the medial aspect.  No erythema, no calor noted.  No drainage is seen.  There is no evidence of  onychocryptosis.  Vascular: Palpable pedal pulses bilaterally. Capillary refill within normal limits.  No appreciable edema.    Neurological: Light touch sensation intact bilateral.  MMT 5/5 to lower extremity bilateral. Negative Tinel's sign with percussion of the posterior tibial nerve on the affected extremity.    Musculoskeletal Exam:  There is pain on palpation of the posterior aspect of the bilateral heel.  No palpable gaps or nodules noted within the achilles tendon.  Antalgic gait noted with first steps out of exam chair.  No pain on palpation of the plantar heel.  No ecchymosis or edema to posterior heel.  There is palpable bony enlargement of the posterior aspect of the calcaneus consistent with previous x-rays showing spurs in the area.  Radiographic Exam:  Normal osseous mineralization. Joint spaces preserved.  No fractures or osseous irregularities noted.  Assessment/Plan of Care: 1. Nail disorder   2. Pain in toes of both feet   3. Achilles tendonitis, bilateral     Meds ordered this encounter  Medications   triamcinolone acetonide (KENALOG) 10 MG/ML injection 10 mg   -Reviewed etiology of achilles tendonitis with patient.  Discussed treatment options with patient today, including cortisone injection, NSAID course of treatment, stretching exercises, use of night splint, physical therapy, rest, icing the heel, arch supports/orthotics, and supportive shoe  gear.    With the patient's consent corticosteroid injections were administered to bilateral heels at the insertion of the Achilles tendon into the calcaneus.  This consisted of 1.5 cc of a mixture of 1% lidocaine plain, and 0.5% Sensorcaine plain, Kenalog 10, and dexamethasone phosphate.  She tolerated this well and Band-Aids were applied.  She may remove Band-Aids within the next few hours.  She will hold off on any high-impact activities or exercise for the next 48 hours.  She may resume stretching exercises 3 days from  now.  The abnormal hallux nails along the medial margin were debulked using a sanding bur.  Advised patient to begin applying either vitamin E oil, aloe gel, or coconut oil to the skin and nail along the medial nail margin every night.  Return if symptoms worsen or fail to improve.   Clerance Lav, DPM, FACFAS [facility]     [address] [place], [place] [address]                Office [phone]  Fax [phone]

## 2023-01-19 DIAGNOSIS — M549 Dorsalgia, unspecified: Secondary | ICD-10-CM | POA: Diagnosis not present

## 2023-01-19 DIAGNOSIS — M7061 Trochanteric bursitis, right hip: Secondary | ICD-10-CM | POA: Diagnosis not present

## 2023-03-09 DIAGNOSIS — I10 Essential (primary) hypertension: Secondary | ICD-10-CM | POA: Diagnosis not present

## 2023-05-12 ENCOUNTER — Ambulatory Visit (INDEPENDENT_AMBULATORY_CARE_PROVIDER_SITE_OTHER): Payer: Medicare HMO

## 2023-05-12 ENCOUNTER — Ambulatory Visit: Payer: Medicare HMO | Admitting: Podiatry

## 2023-05-12 DIAGNOSIS — M79671 Pain in right foot: Secondary | ICD-10-CM

## 2023-05-12 DIAGNOSIS — M7661 Achilles tendinitis, right leg: Secondary | ICD-10-CM

## 2023-05-12 DIAGNOSIS — M71371 Other bursal cyst, right ankle and foot: Secondary | ICD-10-CM

## 2023-05-12 MED ORDER — MELOXICAM 7.5 MG PO TABS: 7.5000 mg | ORAL_TABLET | Freq: Every day | ORAL | 0 refills | Status: DC

## 2023-05-12 NOTE — Progress Notes (Unsigned)
Chief Complaint  Patient presents with   Foot Pain    Right foot. She reports she feels " like the bone is coming out of the bottom of her foot.    HPI: 77 y.o. female presents today for follow-up of bilateral Achilles tendinitis.  She had cortisone injections administered on the last visit.  She notes that the left heel has completely resolved but the right posterior heel is continuing to give her pain.  She feels that it is also swollen.  Denies feeling a snap or pop sensation to the area.  She notes that she does have a cam walker at home but did not realize it is able to be worn on either foot.  Past Medical History:  Diagnosis Date   History of kidney stones 15 yrs ago   Hypertension     Past Surgical History:  Procedure Laterality Date   ABDOMINAL HYSTERECTOMY  yrs ago   complete   COLONOSCOPY WITH PROPOFOL N/A 07/18/2014   Procedure: COLONOSCOPY WITH PROPOFOL;  Surgeon: Charolett Bumpers, MD;  Location: [facility];  Service: Endoscopy;  Laterality: N/A;   kidney stone surgery  15 yrs ago    Allergies  Allergen Reactions   Atelvia [Risedronate Sodium] Diarrhea   Boniva [Ibandronic Acid]     Visual issues   Hydrocodone     headache   Hydrocodone Bit-Homatrop Mbr     Other Reaction(s): headache from this, Unknown   Latex Rash   Sulfa Antibiotics Rash    Physical Exam: Palpable radial pulses noted.  No pain on palpation of the posterior aspect of the left heel.  There is localized edema and fluctuance just anterior to the right Achilles tendon at its insertion into the calcaneus.  There is no calor or ecchymosis noted.  No gaps or nodules are noted within the Achilles tendon.  There is pain on palpation to the posterior aspect of the right heel  Assessment/Plan of Care: 1. Achilles tendinitis, right leg   2. Right foot pain   3. Other bursal cyst, right ankle and foot     Meds ordered this encounter  Medications   meloxicam (MOBIC) 7.5 MG tablet    Sig: Take 1  tablet (7.5 mg total) by mouth daily.    Dispense:  30 tablet    Refill:  0   Discussed clinical findings with patient today.  With the patient's consent a corticosteroid injection was administered to the bursal cyst on the posterior right heel into the area of most discomfort just anterior to the Achilles tendon.  This consisted of a mixture of 1% Xylocaine plain, 0.5% Sensorcaine plain, and Kenalog 10 for total of 1 cc administered.  She tolerated this well.  A Band-Aid was applied.  She will avoid any high impact activities for the next couple of days.  She can start wearing the pneumatic cam walker that she has at home.  Informed the patient that usually the insurance will not cover it if another one is dispensed within 5 years.  She was reminded that these are ambidextrous and she can wear it on either foot.  She notes that it is in good condition so she will possibly wear that if she continues to have pain.  Prescription for meloxicam was sent in so that she can take 1 tablet daily for anti-inflammatory and pain relief.  Follow-up in 4 to 6 weeks for recheck.  Clerance Lav, DPM, FACFAS [facility]  [address] [place], [place] [address]                Office [phone]  Fax [phone]

## 2023-06-08 ENCOUNTER — Other Ambulatory Visit: Payer: Self-pay | Admitting: Podiatry

## 2023-07-15 ENCOUNTER — Telehealth: Payer: Self-pay | Admitting: Podiatry

## 2023-07-15 MED ORDER — MELOXICAM 15 MG PO TABS: 15.0000 mg | ORAL_TABLET | Freq: Every day | ORAL | 2 refills | Status: DC

## 2023-07-15 NOTE — Telephone Encounter (Signed)
Patient's daughter called and requested the [facility] on [address] in [place]. The [facility] pharmacy is incorrect. Thank you.

## 2023-07-15 NOTE — Telephone Encounter (Signed)
I called the patient's daughter and let her know that she can call the pharmacy and have it transferred over.

## 2023-07-15 NOTE — Telephone Encounter (Signed)
Patient's daughter called and said that the patient is in a lot of pain and would like pain medicine sent to the pharmacy. Thank you.

## 2023-07-27 ENCOUNTER — Ambulatory Visit (INDEPENDENT_AMBULATORY_CARE_PROVIDER_SITE_OTHER): Payer: No Typology Code available for payment source | Admitting: Podiatry

## 2023-07-27 ENCOUNTER — Encounter: Payer: Self-pay | Admitting: Podiatry

## 2023-07-27 VITALS — Ht 63.0 in | Wt 160.0 lb

## 2023-07-27 DIAGNOSIS — M7661 Achilles tendinitis, right leg: Secondary | ICD-10-CM | POA: Diagnosis not present

## 2023-07-27 MED ORDER — MELOXICAM 15 MG PO TABS: 15.0000 mg | ORAL_TABLET | Freq: Every day | ORAL | 2 refills | Status: AC

## 2023-07-27 MED ORDER — TRIAMCINOLONE ACETONIDE 10 MG/ML IJ SUSP
10.0000 mg | Freq: Once | INTRAMUSCULAR | Status: AC
  Administered 2023-07-27: 10 mg

## 2023-07-27 NOTE — Progress Notes (Unsigned)
 Chief Complaint  Patient presents with   Ankle Pain    " The boot is helping with her ankle and she has soreness in the big toes"   HPI: 78 y.o. female presenting today with c/o pain in the back of the right heel.  She started wearing her cam walker again due to the pain.  As she still is asymptomatic in the left posterior heel.  Denies any new injury that flared up the right heel.  She has been taking the meloxicam as needed.  Past Medical History:  Diagnosis Date   History of kidney stones 15 yrs ago   Hypertension     Past Surgical History:  Procedure Laterality Date   ABDOMINAL HYSTERECTOMY  yrs ago   complete   COLONOSCOPY WITH PROPOFOL N/A 07/18/2014   Procedure: COLONOSCOPY WITH PROPOFOL;  Surgeon: Charolett Bumpers, MD;  Location: [facility];  Service: Endoscopy;  Laterality: N/A;   kidney stone surgery  15 yrs ago    Allergies  Allergen Reactions   Atelvia [Risedronate Sodium] Diarrhea   Boniva [Ibandronate]     Visual issues   Hydrocodone     headache   Hydrocodone Bit-Homatrop Mbr     Other Reaction(s): headache from this, Unknown   Latex Rash   Sulfa Antibiotics Rash    Physical Exam: General: The patient is alert and oriented x3 in no acute distress.  Dermatology:  No ecchymosis, erythema, or edema bilateral.  No open lesions.    Vascular: Palpable pedal pulses bilaterally. Capillary refill within normal limits.  Localized edema anterior to the Achilles tendon on the right heel near the insertion.  Neurological: Light touch sensation intact bilateral.  MMT 5/5 to lower extremity bilateral. Negative Tinel's sign with percussion of the posterior tibial nerve on the affected extremity.    Musculoskeletal Exam:  There is pain on palpation of the posterior aspect of the right heel at the Achilles tendon insertion.  No palpable gaps or nodules noted within the achilles tendon.  Antalgic gait noted with first steps out of exam chair.  No pain on palpation of the  plantar heel.  No pain with squeezing of Kager's triangle today on the right.  Assessment/Plan of Care: 1. Achilles tendinitis, right leg     Meds ordered this encounter  Medications   meloxicam (MOBIC) 15 MG tablet    Sig: Take 1 tablet (15 mg total) by mouth daily.    Dispense:  30 tablet    Refill:  2   triamcinolone acetonide (KENALOG) 10 MG/ML injection 10 mg   -Reviewed etiology of achilles tendonitis with patient.  Discussed treatment options with patient today, including cortisone injection, NSAID course of treatment, stretching exercises, use of night splint, physical therapy, rest, icing the heel, arch supports/orthotics, and supportive shoe gear.    With the patient's verbal consent, a corticosteroid injection was adminstered to the insertion of the Achilles into the posterior aspect of the right calcaneus.  This consisted of a mixture of 1% lidocaine plain, 0.5% sensorcaine plain, and Kenalog-10 for a total of 0.5 cc's administered.  Bandaid applied. Patient tolerated this well.     Will have patient call in the next 2 to 3 weeks if she is still not having improvement.  If this is the case, we will get her set up for an MRI of the right heel to evaluate the Achilles tendon.  Do not recommend additional cortisone injections to the posterior right heel at this time.  Will renew her meloxicam 15 mg 1 tablet p.o. daily as needed pain.  She was dispensed felt heel lifts and these were trimmed to fit into her shoes.  She will wear these in both shoes so as not to create a limb length discrepancy.   Clerance Lav, DPM, FACFAS [facility]     [address] [place], [place] [address]                Office [phone]  Fax [phone]

## 2023-08-05 ENCOUNTER — Other Ambulatory Visit: Payer: Self-pay | Admitting: Family Medicine

## 2023-08-05 DIAGNOSIS — Z1231 Encounter for screening mammogram for malignant neoplasm of breast: Secondary | ICD-10-CM

## 2023-08-19 ENCOUNTER — Ambulatory Visit
Admission: RE | Admit: 2023-08-19 | Discharge: 2023-08-19 | Disposition: A | Discharge status: Home / Self Care | Source: Ambulatory Visit | Attending: Family Medicine | Admitting: Family Medicine

## 2023-08-19 DIAGNOSIS — Z1231 Encounter for screening mammogram for malignant neoplasm of breast: Secondary | ICD-10-CM

## 2023-09-14 ENCOUNTER — Encounter: Payer: Self-pay | Admitting: Podiatry

## 2023-09-14 ENCOUNTER — Ambulatory Visit (INDEPENDENT_AMBULATORY_CARE_PROVIDER_SITE_OTHER): Admitting: Podiatry

## 2023-09-14 VITALS — Resp 16 | Ht 63.0 in | Wt 160.0 lb

## 2023-09-14 DIAGNOSIS — M7661 Achilles tendinitis, right leg: Secondary | ICD-10-CM

## 2023-09-14 DIAGNOSIS — M79671 Pain in right foot: Secondary | ICD-10-CM | POA: Diagnosis not present

## 2023-09-14 DIAGNOSIS — G8929 Other chronic pain: Secondary | ICD-10-CM | POA: Diagnosis not present

## 2023-09-14 MED ORDER — DIFLUNISAL 500 MG PO TABS: 500.0000 mg | ORAL_TABLET | Freq: Two times a day (BID) | ORAL | 2 refills | Status: DC

## 2023-09-14 NOTE — Progress Notes (Unsigned)
      Chief Complaint  Patient presents with   Tendonitis    " My right ankle still having a lot of pain and causing more pain all over my body, my big toes have a tooth ache and I was not able to order what provider had recommended'   HPI: 78 y.o. female presents today with continued pain in the right posterior heel.  Her daughter is with her today.  She has been wearing the pneumatic cam walker with minimal improvement.  She does not feel that the meloxicam has been providing significant improvement for her.  She limps on a daily basis secondary to pain.  Denies any snapping or popping sensation to the posterior heel.  Denies any sudden weakness to the posterior heel  Past Medical History:  Diagnosis Date   History of kidney stones 15 yrs ago   Hypertension     Past Surgical History:  Procedure Laterality Date   ABDOMINAL HYSTERECTOMY  yrs ago   complete   COLONOSCOPY WITH PROPOFOL N/A 07/18/2014   Procedure: COLONOSCOPY WITH PROPOFOL;  Surgeon: Charolett Bumpers, MD;  Location: [facility];  Service: Endoscopy;  Laterality: N/A;   kidney stone surgery  15 yrs ago    Allergies  Allergen Reactions   Atelvia [Risedronate Sodium] Diarrhea   Boniva [Ibandronate]     Visual issues   Hydrocodone     headache   Hydrocodone Bit-Homatrop Mbr     Other Reaction(s): headache from this, Unknown   Latex Rash   Sulfa Antibiotics Rash    Physical Exam: Vitals:   09/14/23 1625  Resp: 16  Height: 5 foot 3 inches Weight: 72.6 kg  Assessment/Plan of Care: 1. Achilles tendinitis, right leg   2. Chronic heel pain, right     Meds ordered this encounter  Medications   diflunisal (DOLOBID) 500 MG TABS tablet    Sig: Take 1 tablet (500 mg total) by mouth 2 (two) times daily.    Dispense:  60 tablet    Refill:  2   AMB REFERRAL TO PHYSICAL THERAPY MR ANKLE RIGHT WO CONTRAST  Discussed clinical findings with patient today.  Will switch the patient over to Dolobid 500 mg 1 tab p.o.  daily to see if she responds better to this anti-inflammatory.  MRI ordered to evaluate the integrity of the Achilles tendon.  Will call to review with patient once the final report is received from the radiologist.  Informed the patient that this may take up to 2 to 3 weeks due to the delay and the radiologist reading the MRIs.  Continue with the cam walker if this is the most comfortable type of footwear for the patient at this time until we have reviewed the MRI  Prescription for physical therapy provided to the patient today to evaluate and treat the chronic right Achilles tendinitis.   Clerance Lav, DPM, FACFAS [facility]     [address] [place], [place] [address]                Office [phone]  Fax [phone]

## 2023-09-20 ENCOUNTER — Emergency Department (HOSPITAL_BASED_OUTPATIENT_CLINIC_OR_DEPARTMENT_OTHER)
Admission: EM | Admit: 2023-09-20 | Discharge: 2023-09-20 | Disposition: A | Discharge status: Home / Self Care | Attending: Emergency Medicine | Admitting: Emergency Medicine

## 2023-09-20 ENCOUNTER — Encounter (HOSPITAL_BASED_OUTPATIENT_CLINIC_OR_DEPARTMENT_OTHER): Payer: Self-pay | Admitting: Emergency Medicine

## 2023-09-20 ENCOUNTER — Emergency Department (HOSPITAL_BASED_OUTPATIENT_CLINIC_OR_DEPARTMENT_OTHER)

## 2023-09-20 DIAGNOSIS — M79671 Pain in right foot: Secondary | ICD-10-CM | POA: Diagnosis not present

## 2023-09-20 DIAGNOSIS — I10 Essential (primary) hypertension: Secondary | ICD-10-CM | POA: Insufficient documentation

## 2023-09-20 DIAGNOSIS — Z91040 Latex allergy status: Secondary | ICD-10-CM | POA: Insufficient documentation

## 2023-09-20 MED ORDER — OXYCODONE-ACETAMINOPHEN 5-325 MG PO TABS
1.0000 | ORAL_TABLET | Freq: Once | ORAL | Status: AC
  Administered 2023-09-20: 1 via ORAL
  Filled 2023-09-20: qty 1

## 2023-09-20 MED ORDER — OXYCODONE-ACETAMINOPHEN 5-325 MG PO TABS: 1.0000 | ORAL_TABLET | Freq: Four times a day (QID) | ORAL | 0 refills | Status: AC | PRN

## 2023-09-20 NOTE — Discharge Instructions (Addendum)
 Please follow up closely with your foot specialist for further management of your foot pain. Take pain medication prescribed but be aware it can cause drowsiness

## 2023-09-20 NOTE — ED Provider Notes (Signed)
  EMERGENCY DEPARTMENT AT [facility] Provider Note   CSN: [id number] Arrival date & time: 09/20/23  1812     History  Chief Complaint  Patient presents with   Foot Pain    Gail Palmer is a 78 y.o. female.  The history is provided by the patient and medical records. No language interpreter was used.  Foot Pain     78 year old female history of hypertension presenting with complaint of foot pain.  Patient states she has had ongoing pain to the back of her right heel for months.  States she has been seen evaluated by her foot doctor for it and has received steroid injections several times, has tried wearing boot, and take over-the-counter medication without adequate relief.  She is scheduled to have an MRI of her foot in the near future but today while walking she felt a pop follows with severe pain and swelling to the back of her heel prompting this visit.  She does not complain of any pain to her calf or her knee, denies any numbness.  She tries taking her home medication without relief.  Home Medications Prior to Admission medications   Medication Sig Start Date End Date Taking? Authorizing Provider  allopurinol (ZYLOPRIM) 300 MG tablet Take 300 mg by mouth daily.    [provider]  atenolol-chlorthalidone (TENORETIC) 50-25 MG per tablet Take 1 tablet by mouth daily.    [provider]  azelastine (ASTELIN) 0.1 % nasal spray Place 2 sprays into both nostrils 2 (two) times daily. Use in each nostril as directed    [provider]  cetirizine (ZYRTEC) 5 MG tablet Take 5 mg by mouth daily.    [provider]  clotrimazole-betamethasone (LOTRISONE) cream Apply 1 application topically 2 (two) times daily.    [provider]  colchicine 0.6 MG tablet Take 0.6 mg by mouth daily. For gout    [provider]  diflunisal  (DOLOBID ) 500 MG TABS tablet Take 1 tablet (500 mg total) by mouth 2 (two) times daily. 09/14/23    McCaughan, Dia D, DPM  fexofenadine (ALLEGRA) 180 MG tablet Take 180 mg by mouth daily.    [provider]  fish oil-omega-3 fatty acids 1000 MG capsule Take 2 g by mouth daily.    [provider]  fluticasone (FLONASE) 50 MCG/ACT nasal spray Place into both nostrils daily.    [provider]  Ibandronate Sodium (BONIVA PO) Take by mouth. Once a month    [provider]  meloxicam  (MOBIC ) 15 MG tablet Take 1 tablet (15 mg total) by mouth daily. 07/27/23   McCaughan, Dia D, DPM  Multiple Vitamin (MULTIVITAMIN) tablet Take 1 tablet by mouth daily.    [provider]  neomycin -polymyxin-hydrocortisone (CORTISPORIN) OTIC solution Apply 1-2 drops to toe after soaking twice a day 06/09/18   Brandt Cake, DPM  neomycin -polymyxin-hydrocortisone (CORTISPORIN) OTIC solution Apply 1-2 drops to toe after soaking twice a day 01/30/20   Brandt Cake, DPM  potassium chloride (K-DUR) 10 MEQ tablet Take 20 mEq by mouth 2 (two) times daily.     [provider]  potassium chloride SA (KLOR-CON) 20 MEQ tablet  11/08/19   [provider]  prednisoLONE acetate (PRED FORTE) 1 % ophthalmic suspension 1 drop 4 (four) times daily.    [provider]      Allergies    Atelvia [risedronate sodium], Boniva [ibandronate], Hydrocodone, Hydrocodone bit-homatrop mbr, Latex, and Sulfa antibiotics    Review  of Systems   Review of Systems  Constitutional:  Negative for fever.  Skin:  Negative for wound.    Physical Exam Updated Vital Signs BP (!) 165/62 (BP Location: Left Arm)   Pulse 85   Temp 98.2 F (36.8 C) (Temporal)   Resp 18   SpO2 100%  Physical Exam Vitals and nursing note reviewed.  Constitutional:      General: She is not in acute distress.    Appearance: She is well-developed.  HENT:     Head: Atraumatic.  Eyes:     Conjunctiva/sclera: Conjunctivae normal.  Pulmonary:     Effort: Pulmonary effort is normal.  Musculoskeletal:         General: Tenderness (Right foot: Tenderness and swelling noted to posterior calcaneal region without any erythema or warmth.  No obvious deformity.  Able to dorsiflex and plantarflex.  Negative Thompson sign) present.     Cervical back: Neck supple.  Skin:    Findings: No rash.  Neurological:     Mental Status: She is alert.  Psychiatric:        Mood and Affect: Mood normal.     ED Results / Procedures / Treatments   Labs (all labs ordered are listed, but only abnormal results are displayed) Labs Reviewed - No data to display  EKG None  Radiology No results found.  Procedures Procedures    Medications Ordered in ED Medications - No data to display  ED Course/ Medical Decision Making/ A&P                                 Medical Decision Making Risk Prescription drug management.   BP (!) 165/62 (BP Location: Left Arm)   Pulse 85   Temp 98.2 F (36.8 C) (Temporal)   Resp 18   SpO2 100%   9:29 PM  78 year old female history of hypertension presenting with complaint of foot pain.  Patient states she has had ongoing pain to the back of her right heel for months.  States she has been seen evaluated by her foot doctor for it and has received steroid injections several times, has tried wearing boot, and take over-the-counter medication without adequate relief.  She is scheduled to have an MRI of her foot in the near future but today while walking she felt a pop follows with severe pain and swelling to the back of her heel prompting this visit.  She does not complain of any pain to her calf or her knee, denies any numbness.  She tries taking her home medication without relief.  On exam, patient has point tenderness to her posterior heel on the right foot with associated swelling.  No obvious signs of infection.  She is able to dorsiflex and plantarflex her foot therefore I have low suspicion for complete Achilles tendon rupture.  No tenderness to the sole of the foot to  suggest plantar fasciitis.  After x-ray patient declined.  I offer a boot patient states she has 1 at home already.  Patient initial desires to have MRI of her foot because it would take quite a while before it can be done outpatient.  At this time she does not have any symptoms suggestive of emergent MRI.  I have agreed to prescribe a short course of opiate pain medication to use as needed and patient agrees to follow-up closely with her foot specialist for further care.  Return precaution given.  Final Clinical Impression(s) / ED Diagnoses Final diagnoses:  Pain of right heel    Rx / DC Orders ED Discharge Orders          Ordered    oxyCODONE -acetaminophen  (PERCOCET) 5-325 MG tablet  Every 6 hours PRN        09/20/23 1956              Debbra Fairy, PA-C 09/20/23 1957    Tonya Fredrickson, MD 09/21/23 [phone]

## 2023-09-20 NOTE — ED Triage Notes (Signed)
 Right heel pain x 2 years Today heard a pop and increased pain. Not relieved with pain medication

## 2023-09-21 ENCOUNTER — Telehealth: Payer: Self-pay | Admitting: Podiatry

## 2023-09-21 NOTE — Telephone Encounter (Signed)
 Pt felt a pop in R ankle and started having severe pain. Went to ER last night. Was given some pain meds and told to follow-up with us .  No x-rays was done in ER. Daughter wants to know if she can bring her in today.

## 2023-09-21 NOTE — Telephone Encounter (Signed)
 Checking on status of referral for MRI. Please call daughter Ms. Harrell Lima

## 2023-09-25 ENCOUNTER — Ambulatory Visit: Attending: Podiatry

## 2023-09-25 ENCOUNTER — Other Ambulatory Visit: Payer: Self-pay

## 2023-09-25 DIAGNOSIS — M25571 Pain in right ankle and joints of right foot: Secondary | ICD-10-CM | POA: Diagnosis not present

## 2023-09-25 DIAGNOSIS — R600 Localized edema: Secondary | ICD-10-CM | POA: Insufficient documentation

## 2023-09-25 DIAGNOSIS — R2689 Other abnormalities of gait and mobility: Secondary | ICD-10-CM | POA: Diagnosis not present

## 2023-09-25 DIAGNOSIS — M7661 Achilles tendinitis, right leg: Secondary | ICD-10-CM | POA: Diagnosis not present

## 2023-09-25 NOTE — Therapy (Signed)
 OUTPATIENT PHYSICAL THERAPY LOWER EXTREMITY EVALUATION   Patient Name: Gail Palmer MRN: [id number] DOB:Feb 05, 1946, 78 y.o., female Today's Date: 09/25/2023  END OF SESSION: Visit Number 1 Number of Visits 16 Date for PT re-eval 11/20/2023  Authorization Type: [employer]  PT start time 1311 PT stop time 1341 PT time calculation (min) 30 min    Past Medical History:  Diagnosis Date   History of kidney stones 15 yrs ago   Hypertension    Past Surgical History:  Procedure Laterality Date   ABDOMINAL HYSTERECTOMY  yrs ago   complete   COLONOSCOPY WITH PROPOFOL  N/A 07/18/2014   Procedure: COLONOSCOPY WITH PROPOFOL ;  Surgeon: Gail Kallman, MD;  Location: [facility];  Service: Endoscopy;  Laterality: N/A;   kidney stone surgery  15 yrs ago   Patient Active Problem List   Diagnosis Date Noted   Bilateral impacted cerumen 09/26/2019   ETD (Eustachian tube dysfunction), bilateral 09/26/2019   Rhinitis, chronic 09/26/2019   Sinus drainage 09/26/2019    PCP: Gail Hood, MD   REFERRING PROVIDER: Joe Palmer, DPM   REFERRING DIAG: Achilles tendinitis, right leg [M76.61]   THERAPY DIAG:  Localized edema  Pain in right ankle and joints of right foot  Other abnormalities of gait and mobility  Achilles tendinitis, right leg  Rationale for Evaluation and Treatment: Rehabilitation  ONSET DATE: 2 years   SUBJECTIVE:   SUBJECTIVE STATEMENT:  Patient reports that her Right heel started hurting about 2 years ago. She has been wearing a CAM boot off-and-on for about 1 year. Patient reports that she feels that she is dragging her foot without use of CAM boot. She has not had PT since this started. About 5 days ago, she was not wearing the boot after dinner and remembers hearing her heel/ankle pop. She reports that the swelling has been worse this week. She did go to the ED on Sunday, but no imaging was performed. Many years ago, she remember having a cyst  removed from her heel.   PERTINENT HISTORY: Relevant PMHx includes HTN   PAIN:  Are you having pain?  Yes: NPRS scale: 10/10 current  Pain location: right heel Pain description: sharp "when I step"  Aggravating factors: weight bearing  Relieving factors: prescribed pain med  PRECAUTIONS: None  RED FLAGS: None   WEIGHT BEARING RESTRICTIONS: No  FALLS:  Has patient fallen in last 6 months? No  LIVING ENVIRONMENT: Lives with: lives with their daughter Lives in: House/apartment Stairs: No Has following equipment at home: None  OCCUPATION: n/a  PLOF: Independent, Independent with basic ADLs, Independent with community mobility without device, and Leisure: "walking"   PATIENT GOALS: "To get rid of the pain"   NEXT MD VISIT: Not scheduled at time of evaluation  OBJECTIVE:  Note: Objective measures were completed at Evaluation unless otherwise noted.  DIAGNOSTIC FINDINGS:   MRI scheduled for 09/27/2023  PATIENT SURVEYS:  LEFS 31/80  COGNITION: Overall cognitive status: Within functional limits for tasks assessed     EDEMA:  Localized Edema visible and R achilles insertion with moderate tenderness to palpation    LOWER EXTREMITY ROM:  Active ROM Right eval Left eval  Hip flexion    Hip extension    Hip abduction    Hip adduction    Hip internal rotation    Hip external rotation    Knee flexion    Knee extension    Ankle dorsiflexion -5   Ankle plantarflexion 38   Ankle inversion  25   Ankle eversion 8    (Blank rows = not tested)  LOWER EXTREMITY MMT:  MMT Right eval Left eval  Hip flexion    Hip extension    Hip abduction    Hip adduction    Hip internal rotation    Hip external rotation    Knee flexion    Knee extension    Ankle dorsiflexion 2- 4+  Ankle plantarflexion 3+ 4+  Ankle inversion 4 4+  Ankle eversion 4+ 4+   (Blank rows = not tested)   FUNCTIONAL TESTS:  To be assessed at follow up visit  GAIT: Distance walked: 30  ft from lobby to evaluation room Assistive device utilized: Environmental consultant - 2 wheeled Level of assistance: Modified independence Comments: antalgic gait pattern with CAM boot donned                                                                                                                                TREATMENT DATE:   [facility] Adult PT Treatment:                                                DATE: 09/25/2023  Initial evaluation: see patient education and home exercise program as noted below     PATIENT EDUCATION:  Education details: strongly encouraged to follow up with referring provider by Monday for possible f/u or imagine review d/t recent "pop" and increased local edema; reviewed initial home exercise program; discussion of POC, prognosis and goals for skilled PT   Person educated: Patient and Child(ren) Education method: Explanation, Demonstration, and Handouts Education comprehension: verbalized understanding, returned demonstration, and needs further education  HOME EXERCISE PROGRAM: Access Code: [facility] URL: [email or web address] Date: 09/25/2023 Prepared by: Arlester Bence  Exercises - Supine Ankle Pumps  - 2 x daily - 7 x weekly - 2 sets - 10 reps - Seated Ankle Circles  - 2 x daily - 7 x weekly - 2 sets - 10 reps - Ankle Inversion Eversion Towel Slide  - 2 x daily - 7 x weekly - 2 sets - 10 reps - Seated Ankle Pumps on Table  - 2 x daily - 7 x weekly - 2 sets - 10 reps  ASSESSMENT:  CLINICAL IMPRESSION: Gail Palmer is a 78 y.o. female who was seen today for physical therapy evaluation and treatment related to persistent achilles tendonitis. She is demonstrating diminished ankle AROM, decreased ankle MMT, and altered gait mechanics.  She has related pain and difficulty with household and community ambulation, stair/curb navigation, prolonged standing, and related ADLs/IADLs. Significant localized edema persisting since 09/20/2023, encouraged to ice and to follow  up with referring provider after MRI. He/She requires skilled PT services at this time to address relevant deficits and improve overall function.     OBJECTIVE IMPAIRMENTS: Abnormal  gait, decreased activity tolerance, decreased balance, difficulty walking, decreased ROM, decreased strength, increased edema, and pain.   ACTIVITY LIMITATIONS: bending, standing, squatting, stairs, transfers, bed mobility, and locomotion level  PARTICIPATION LIMITATIONS: meal prep, cleaning, and community activity  PERSONAL FACTORS: Age, Time since onset of injury/illness/exacerbation, and 1 comorbidity: Hypertension  are also affecting patient's functional outcome.   REHAB POTENTIAL: Fair    CLINICAL DECISION MAKING: Stable/uncomplicated  EVALUATION COMPLEXITY: Low   GOALS: Goals reviewed with patient? YES  SHORT TERM GOALS: Target date: 10/26/2023   Patient will be independent with initial home program at least 3 days/week.  Baseline: provided at eval Goal Status: INITIAL   2.  Patient will demonstrate at least 0 degrees of R ankle DF AROM.  Baseline: see objective measures Goal Status: INITIAL     LONG TERM GOALS: Target date: 11/20/2023   Patient will report improved overall functional ability with LEFS score of 55/80.  Baseline: 31/80 Goal Status: INITIAL   2.  Patient will demonstrate at least 4+/5 R ankle MMT Baseline: see objective measures Goal status: INITIAL  3.  Patient will demonstrate ability to safely ascend/descend at least 5 steps, at standard step height of 6" or greater.  Baseline: severe pain/difficulty Goal status: INITIAL   4.  Patient will demonstrate ability to tolerate at least 20 minutes of weight bearing activities with <4/10 pain severity.  Baseline: moderate-to-severe pain with standing/ambulation >10 minutes  Goal status: INITIAL     PLAN:  PT FREQUENCY: 1-2x/week  PT DURATION: 8 weeks  PLANNED INTERVENTIONS: 97164- PT Re-evaluation, 97750-  Physical Performance Testing, 97110-Therapeutic exercises, 97530- Therapeutic activity, [id number]- Neuromuscular re-education, 97535- Self Care, 07371- Manual therapy, [phone]- Gait training, [phone]- Aquatic Therapy, [phone]- Electrical stimulation (unattended), 97016- Vasopneumatic device, 97033- Ionotophoresis 4mg /ml Dexamethasone, Patient/Family education, Balance training, Stair training, Taping, Dry Needling, Joint mobilization, Joint manipulation, Cryotherapy, and Moist heat  PLAN FOR NEXT SESSION: address ankle ROM, MMT, and gait mechanics; use of manual therapy, aerobic activity and modalities as indicated    Arlester Bence, PT, DPT  09/28/2023 9:45 AM

## 2023-09-27 ENCOUNTER — Ambulatory Visit
Admission: RE | Admit: 2023-09-27 | Discharge: 2023-09-27 | Disposition: A | Discharge status: Home / Self Care | Source: Ambulatory Visit | Attending: Podiatry | Admitting: Podiatry

## 2023-09-27 DIAGNOSIS — M7671 Peroneal tendinitis, right leg: Secondary | ICD-10-CM | POA: Diagnosis not present

## 2023-09-27 DIAGNOSIS — R600 Localized edema: Secondary | ICD-10-CM | POA: Diagnosis not present

## 2023-09-27 DIAGNOSIS — M7661 Achilles tendinitis, right leg: Secondary | ICD-10-CM

## 2023-09-27 DIAGNOSIS — S86011A Strain of right Achilles tendon, initial encounter: Secondary | ICD-10-CM | POA: Diagnosis not present

## 2023-09-30 DIAGNOSIS — Z0000 Encounter for general adult medical examination without abnormal findings: Secondary | ICD-10-CM | POA: Diagnosis not present

## 2023-09-30 DIAGNOSIS — N1831 Chronic kidney disease, stage 3a: Secondary | ICD-10-CM | POA: Diagnosis not present

## 2023-09-30 DIAGNOSIS — Z23 Encounter for immunization: Secondary | ICD-10-CM | POA: Diagnosis not present

## 2023-09-30 DIAGNOSIS — Z1211 Encounter for screening for malignant neoplasm of colon: Secondary | ICD-10-CM | POA: Diagnosis not present

## 2023-09-30 DIAGNOSIS — E7800 Pure hypercholesterolemia, unspecified: Secondary | ICD-10-CM | POA: Diagnosis not present

## 2023-09-30 DIAGNOSIS — J309 Allergic rhinitis, unspecified: Secondary | ICD-10-CM | POA: Diagnosis not present

## 2023-09-30 DIAGNOSIS — Z1331 Encounter for screening for depression: Secondary | ICD-10-CM | POA: Diagnosis not present

## 2023-09-30 DIAGNOSIS — I10 Essential (primary) hypertension: Secondary | ICD-10-CM | POA: Diagnosis not present

## 2023-10-09 ENCOUNTER — Telehealth: Payer: Self-pay | Admitting: Podiatry

## 2023-10-09 NOTE — Telephone Encounter (Signed)
 Patient's daughter Lang Pipes) is requesting MRI results as soon as possible. Contact telephone number, [phone]

## 2023-10-12 ENCOUNTER — Ambulatory Visit (INDEPENDENT_AMBULATORY_CARE_PROVIDER_SITE_OTHER): Admitting: Podiatry

## 2023-10-12 ENCOUNTER — Encounter: Payer: Self-pay | Admitting: Podiatry

## 2023-10-12 DIAGNOSIS — M7731 Calcaneal spur, right foot: Secondary | ICD-10-CM

## 2023-10-12 DIAGNOSIS — S86011A Strain of right Achilles tendon, initial encounter: Secondary | ICD-10-CM | POA: Diagnosis not present

## 2023-10-13 NOTE — Progress Notes (Signed)
 Chief Complaint  Patient presents with   Tendonitis    "It's not healing, it's worse than ever.  I had a MRI done."    HPI: 78 y.o. female presenting today for follow-up evaluation of pain and tenderness associated to the posterior heel right lower extremity.  This has been an ongoing problem for a few years now exacerbated on Easter Sunday, 09/20/2023, when she heard an audible pop.  Since that time she has had severe pain and tenderness.  MRI was originally scheduled and it was completed on 09/27/2023.  Final read pending.  Currently WBAT in a cam boot with severe pain and tenderness.  Presenting today with her daughter  Past Medical History:  Diagnosis Date   History of kidney stones 15 yrs ago   Hypertension     Past Surgical History:  Procedure Laterality Date   ABDOMINAL HYSTERECTOMY  yrs ago   complete   COLONOSCOPY WITH PROPOFOL  N/A 07/18/2014   Procedure: COLONOSCOPY WITH PROPOFOL ;  Surgeon: Garrett Kallman, MD;  Location: [facility];  Service: Endoscopy;  Laterality: N/A;   kidney stone surgery  15 yrs ago    Allergies  Allergen Reactions   Atelvia [Risedronate Sodium] Diarrhea   Boniva [Ibandronate]     Visual issues   Hydrocodone     headache   Hydrocodone Bit-Homatrop Mbr     Other Reaction(s): headache from this, Unknown   Latex Rash   Sulfa Antibiotics Rash     Physical Exam: General: The patient is alert and oriented x3 in no acute distress.  Dermatology: Skin is warm, dry and supple bilateral lower extremities.   Vascular: Palpable pedal pulses bilaterally. Capillary refill within normal limits.  No erythema.  Moderate edema noted localized to the posterior aspect of the heel and Achilles  Neurological: Grossly intact via light touch  Musculoskeletal Exam: Palpable dell noted just proximal to the Achilles tendon insert [place] with enlargement of the avulsed portion of the Achilles.  Severe pain and tenderness associated to the posterior tubercle of  the calcaneus and along the Achilles tendon.  Moderate loss of plantar flexor strength to the right lower extremity  Radiographic Exam RT foot 05/12/2023:  Posterior heel spur noted on lateral view with incongruency of the Achilles tendon and disruption of Kager triangle concerning for possible Achilles rupture  MR ANKLE RIGHT WO CONTRAST 09/27/2023: Pending final read  Assessment/Plan of Care: 1.  Achilles tendon rupture right 2.  Posterior heel spur right  The MRI was evaluated and reviewed today by myself as well as with the patient in detail.  MRI indicates complete rupture of the Achilles tendon at the insertion with a small amount of retraction.  This was discussed with the patient.   -Unfortunately the patient has been in chronic pain and tenderness associated to the posterior heel ongoing for several years exacerbated by recent injury sustained on Easter Sunday, 09/20/2023.  She has failed years of conservative treatment and management including corticosteroid anti-inflammatory, oral anti-inflammatory, physical therapy, shoe gear modifications, and unfortunately she continues to have chronic severe pain and tenderness.  I do believe the surgery at this time is appropriate especially with findings of Achilles rupture -Today we discussed the details of surgery which would include posterior heel spur resection with repair of Achilles tendon right lower extremity.  Risk benefits advantages and disadvantages of the procedure were explained in detail in length to the patient.  No guarantees were expressed or implied.  All patient questions were answered.  The postoperative recovery course was also explained in detail.  After lengthy discussion with the patient and her daughter they consent for surgical correction at this time -Authorization for surgery was initiated today.  Surgery will consist of posterior heel spur resection right.  Repair of Achilles tendon right -In the meantime continue WBAT CAM  boot -Return to clinic 1 week postop       Dot Gazella, DPM [facility]  Dr. Dot Gazella, DPM    [address] [place], [place] [address]                Office [phone]  Fax [phone]

## 2023-10-14 ENCOUNTER — Ambulatory Visit

## 2023-10-14 ENCOUNTER — Telehealth: Payer: Self-pay | Admitting: Podiatry

## 2023-10-14 NOTE — Telephone Encounter (Signed)
 Received surgical consent, Left message for pt to call to schedule her surgery.

## 2023-10-21 ENCOUNTER — Ambulatory Visit: Payer: Self-pay | Admitting: Podiatry

## 2023-10-21 ENCOUNTER — Ambulatory Visit

## 2023-10-21 DIAGNOSIS — S86011A Strain of right Achilles tendon, initial encounter: Secondary | ICD-10-CM | POA: Diagnosis not present

## 2023-11-04 NOTE — Telephone Encounter (Signed)
 Reviewed chart after receiving note and see that results were already gone over by Dr. Luster Salters while Dr. Estle Hemp was out of the office and SX is being scheduled.  CT

## 2023-11-06 DIAGNOSIS — N1831 Chronic kidney disease, stage 3a: Secondary | ICD-10-CM | POA: Diagnosis not present

## 2023-11-06 DIAGNOSIS — D649 Anemia, unspecified: Secondary | ICD-10-CM | POA: Diagnosis not present

## 2023-11-18 DIAGNOSIS — S86011D Strain of right Achilles tendon, subsequent encounter: Secondary | ICD-10-CM | POA: Diagnosis not present

## 2023-11-23 DIAGNOSIS — M6281 Muscle weakness (generalized): Secondary | ICD-10-CM | POA: Diagnosis not present

## 2023-11-23 DIAGNOSIS — S86021D Laceration of right Achilles tendon, subsequent encounter: Secondary | ICD-10-CM | POA: Diagnosis not present

## 2023-11-30 DIAGNOSIS — M6281 Muscle weakness (generalized): Secondary | ICD-10-CM | POA: Diagnosis not present

## 2023-11-30 DIAGNOSIS — S86021D Laceration of right Achilles tendon, subsequent encounter: Secondary | ICD-10-CM | POA: Diagnosis not present

## 2023-12-06 ENCOUNTER — Other Ambulatory Visit: Payer: Self-pay | Admitting: Podiatry

## 2023-12-07 DIAGNOSIS — M6281 Muscle weakness (generalized): Secondary | ICD-10-CM | POA: Diagnosis not present

## 2023-12-07 DIAGNOSIS — S86021D Laceration of right Achilles tendon, subsequent encounter: Secondary | ICD-10-CM | POA: Diagnosis not present

## 2023-12-14 DIAGNOSIS — M6281 Muscle weakness (generalized): Secondary | ICD-10-CM | POA: Diagnosis not present

## 2023-12-14 DIAGNOSIS — S86021D Laceration of right Achilles tendon, subsequent encounter: Secondary | ICD-10-CM | POA: Diagnosis not present

## 2023-12-16 DIAGNOSIS — M25571 Pain in right ankle and joints of right foot: Secondary | ICD-10-CM | POA: Diagnosis not present

## 2024-01-07 DIAGNOSIS — N1831 Chronic kidney disease, stage 3a: Secondary | ICD-10-CM | POA: Diagnosis not present

## 2024-01-07 DIAGNOSIS — M7632 Iliotibial band syndrome, left leg: Secondary | ICD-10-CM | POA: Diagnosis not present

## 2024-01-23 DIAGNOSIS — R399 Unspecified symptoms and signs involving the genitourinary system: Secondary | ICD-10-CM | POA: Diagnosis not present

## 2024-01-23 DIAGNOSIS — R300 Dysuria: Secondary | ICD-10-CM | POA: Diagnosis not present

## 2024-02-23 DIAGNOSIS — H2513 Age-related nuclear cataract, bilateral: Secondary | ICD-10-CM | POA: Diagnosis not present

## 2024-02-23 DIAGNOSIS — H524 Presbyopia: Secondary | ICD-10-CM | POA: Diagnosis not present

## 2024-03-17 ENCOUNTER — Other Ambulatory Visit: Payer: Self-pay | Admitting: Podiatry

## 2024-03-31 DIAGNOSIS — I10 Essential (primary) hypertension: Secondary | ICD-10-CM | POA: Diagnosis not present

## 2024-03-31 DIAGNOSIS — D649 Anemia, unspecified: Secondary | ICD-10-CM | POA: Diagnosis not present

## 2024-06-24 ENCOUNTER — Other Ambulatory Visit: Payer: Self-pay | Admitting: Podiatry
